# Patient Record
Sex: Female | Born: 1960 | Race: White | Hispanic: No | Marital: Married | Smoking: Former smoker
Health system: Southern US, Community
[De-identification: ages and names within clinical notes are randomized; demographics above are authoritative.]

## PROBLEM LIST (undated history)

## (undated) DIAGNOSIS — I10 Essential (primary) hypertension: Secondary | ICD-10-CM

## (undated) DIAGNOSIS — F32A Depression, unspecified: Secondary | ICD-10-CM

## (undated) DIAGNOSIS — M7989 Other specified soft tissue disorders: Secondary | ICD-10-CM

## (undated) DIAGNOSIS — K219 Gastro-esophageal reflux disease without esophagitis: Secondary | ICD-10-CM

## (undated) DIAGNOSIS — F329 Major depressive disorder, single episode, unspecified: Secondary | ICD-10-CM

## (undated) DIAGNOSIS — G43909 Migraine, unspecified, not intractable, without status migrainosus: Secondary | ICD-10-CM

## (undated) DIAGNOSIS — F988 Other specified behavioral and emotional disorders with onset usually occurring in childhood and adolescence: Secondary | ICD-10-CM

## (undated) DIAGNOSIS — G4730 Sleep apnea, unspecified: Secondary | ICD-10-CM

## (undated) DIAGNOSIS — E7800 Pure hypercholesterolemia, unspecified: Secondary | ICD-10-CM

## (undated) HISTORY — DX: Other specified soft tissue disorders: M79.89

## (undated) HISTORY — DX: Essential (primary) hypertension: I10

## (undated) HISTORY — DX: Other specified behavioral and emotional disorders with onset usually occurring in childhood and adolescence: F98.8

## (undated) HISTORY — DX: Major depressive disorder, single episode, unspecified: F32.9

## (undated) HISTORY — DX: Sleep apnea, unspecified: G47.30

## (undated) HISTORY — DX: Pure hypercholesterolemia, unspecified: E78.00

## (undated) HISTORY — DX: Depression, unspecified: F32.A

## (undated) HISTORY — DX: Migraine, unspecified, not intractable, without status migrainosus: G43.909

## (undated) HISTORY — DX: Gastro-esophageal reflux disease without esophagitis: K21.9

---
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
## 1979-06-30 HISTORY — PX: RHINOPLASTY: SUR1284

## 1998-05-08 ENCOUNTER — Ambulatory Visit (HOSPITAL_COMMUNITY): Admission: RE | Admit: 1998-05-08 | Discharge: 1998-05-08 | Payer: Self-pay | Admitting: Family Medicine

## 2000-01-23 ENCOUNTER — Encounter: Admission: RE | Admit: 2000-01-23 | Discharge: 2000-01-23 | Payer: Self-pay | Admitting: *Deleted

## 2000-01-23 ENCOUNTER — Encounter: Payer: Self-pay | Admitting: *Deleted

## 2000-02-05 ENCOUNTER — Other Ambulatory Visit: Admission: RE | Admit: 2000-02-05 | Discharge: 2000-02-05 | Payer: Self-pay | Admitting: Obstetrics and Gynecology

## 2001-12-08 ENCOUNTER — Encounter: Payer: Self-pay | Admitting: *Deleted

## 2001-12-08 ENCOUNTER — Encounter: Admission: RE | Admit: 2001-12-08 | Discharge: 2001-12-08 | Payer: Self-pay | Admitting: *Deleted

## 2003-06-26 ENCOUNTER — Encounter: Admission: RE | Admit: 2003-06-26 | Discharge: 2003-06-26 | Payer: Self-pay | Admitting: Surgery

## 2003-12-14 ENCOUNTER — Encounter: Admission: RE | Admit: 2003-12-14 | Discharge: 2003-12-14 | Payer: Self-pay | Admitting: Surgery

## 2003-12-21 ENCOUNTER — Encounter: Admission: RE | Admit: 2003-12-21 | Discharge: 2003-12-21 | Payer: Self-pay | Admitting: Family Medicine

## 2005-02-10 ENCOUNTER — Encounter: Admission: RE | Admit: 2005-02-10 | Discharge: 2005-02-10 | Payer: Self-pay | Admitting: Family Medicine

## 2006-02-05 ENCOUNTER — Other Ambulatory Visit: Admission: RE | Admit: 2006-02-05 | Discharge: 2006-02-05 | Payer: Self-pay | Admitting: Obstetrics and Gynecology

## 2006-02-11 ENCOUNTER — Encounter: Admission: RE | Admit: 2006-02-11 | Discharge: 2006-02-11 | Payer: Self-pay | Admitting: Family Medicine

## 2007-04-06 ENCOUNTER — Other Ambulatory Visit: Admission: RE | Admit: 2007-04-06 | Discharge: 2007-04-06 | Payer: Self-pay | Admitting: Obstetrics and Gynecology

## 2008-08-30 ENCOUNTER — Other Ambulatory Visit: Admission: RE | Admit: 2008-08-30 | Discharge: 2008-08-30 | Payer: Self-pay | Admitting: Obstetrics and Gynecology

## 2009-07-25 ENCOUNTER — Encounter: Admission: RE | Admit: 2009-07-25 | Discharge: 2009-07-25 | Payer: Self-pay | Admitting: Family Medicine

## 2009-09-24 ENCOUNTER — Other Ambulatory Visit: Admission: RE | Admit: 2009-09-24 | Discharge: 2009-09-24 | Payer: Self-pay | Admitting: Obstetrics and Gynecology

## 2011-03-31 ENCOUNTER — Other Ambulatory Visit: Payer: Self-pay | Admitting: Family Medicine

## 2011-03-31 DIAGNOSIS — Z1231 Encounter for screening mammogram for malignant neoplasm of breast: Secondary | ICD-10-CM

## 2011-04-13 ENCOUNTER — Ambulatory Visit
Admission: RE | Admit: 2011-04-13 | Discharge: 2011-04-13 | Disposition: A | Discharge status: Home / Self Care | Payer: BC Managed Care – PPO | Source: Ambulatory Visit | Attending: Family Medicine | Admitting: Family Medicine

## 2011-04-13 DIAGNOSIS — Z1231 Encounter for screening mammogram for malignant neoplasm of breast: Secondary | ICD-10-CM

## 2011-05-11 ENCOUNTER — Other Ambulatory Visit: Payer: Self-pay | Admitting: Family Medicine

## 2011-05-11 ENCOUNTER — Other Ambulatory Visit (HOSPITAL_COMMUNITY)
Admission: RE | Admit: 2011-05-11 | Discharge: 2011-05-11 | Disposition: A | Discharge status: Home / Self Care | Payer: BC Managed Care – PPO | Source: Ambulatory Visit | Attending: Family Medicine | Admitting: Family Medicine

## 2011-05-11 DIAGNOSIS — Z124 Encounter for screening for malignant neoplasm of cervix: Secondary | ICD-10-CM | POA: Insufficient documentation

## 2013-01-11 ENCOUNTER — Other Ambulatory Visit: Payer: Self-pay

## 2013-01-11 DIAGNOSIS — Z1231 Encounter for screening mammogram for malignant neoplasm of breast: Secondary | ICD-10-CM

## 2013-02-03 ENCOUNTER — Ambulatory Visit
Admission: RE | Admit: 2013-02-03 | Discharge: 2013-02-03 | Disposition: A | Discharge status: Home / Self Care | Payer: BC Managed Care – PPO | Source: Ambulatory Visit

## 2013-02-03 DIAGNOSIS — Z1231 Encounter for screening mammogram for malignant neoplasm of breast: Secondary | ICD-10-CM

## 2013-09-18 ENCOUNTER — Encounter: Payer: Self-pay | Admitting: Podiatry

## 2013-09-18 ENCOUNTER — Ambulatory Visit (INDEPENDENT_AMBULATORY_CARE_PROVIDER_SITE_OTHER): Payer: BC Managed Care – PPO | Admitting: Podiatry

## 2013-09-18 ENCOUNTER — Ambulatory Visit (INDEPENDENT_AMBULATORY_CARE_PROVIDER_SITE_OTHER): Payer: BC Managed Care – PPO

## 2013-09-18 VITALS — BP 134/81 | HR 100 | Resp 16 | Ht 65.0 in | Wt 260.0 lb

## 2013-09-18 DIAGNOSIS — M722 Plantar fascial fibromatosis: Secondary | ICD-10-CM

## 2013-09-18 MED ORDER — TRIAMCINOLONE ACETONIDE 10 MG/ML IJ SUSP
10.0000 mg | Freq: Once | INTRAMUSCULAR | Status: AC
  Administered 2013-09-18: 10 mg

## 2013-09-18 MED ORDER — DICLOFENAC SODIUM 75 MG PO TBEC: 75.0000 mg | DELAYED_RELEASE_TABLET | Freq: Two times a day (BID) | ORAL | Status: DC

## 2013-09-18 NOTE — Patient Instructions (Signed)

## 2013-09-18 NOTE — Progress Notes (Signed)
   Subjective:    Patient ID: Mary Larsen, female    DOB: Apr 17, 1961, 53 y.o.   MRN: [id number]  HPI Comments: "I have pain in the heel"  Patient c/o throbbing plantar heel right for 6 months. No AM pain. Swelling some by end of day. She is a Runner, broadcasting/film/videoteacher, so stands all day. Treated for fasciitis several years ago. Has tried inserts, gel cups, stretching-no relief.     Review of Systems  Constitutional: Positive for fatigue and unexpected weight change.  Cardiovascular: Positive for leg swelling.  Neurological: Positive for headaches.  Hematological: Bruises/bleeds easily.  Psychiatric/Behavioral: The patient is nervous/anxious.   All other systems reviewed and are negative.       Objective:   Physical Exam        Assessment & Plan:

## 2013-09-19 NOTE — Progress Notes (Signed)
Subjective:     Patient ID: Mary Larsen, female   DOB: 1961-04-03, 53 y.o.   MRN: [id number]  HPI patient presents with severe heel pain right of 6 months duration. States she's tried over-the-counter insoles stretching and gel cups without relief of symptoms and she cannot do any form of activity   Review of Systems  All other systems reviewed and are negative.       Objective:   Physical Exam  Nursing note and vitals reviewed. Constitutional: She is oriented to person, place, and time.  Cardiovascular: Intact distal pulses.   Musculoskeletal: Normal range of motion.  Neurological: She is oriented to person, place, and time.   neurovascular status intact with discomfort of a significant nature plantar right heel at the insertion of the tendon into the calcaneus     Assessment:     Severe plantar fasciitis right plantar heel insertional    Plan:     H&P and x-ray reviewed. At this time I injected the plantar fascia right 3 mg Kenalog 5 mg Xylocaine Marcaine mixture and applied fascially brace and placed on oral anti-inflammatory therapy. Patient will be seen back in 2 weeks and at this time I discussed orthotics for the long term to try to solve problem and I also gave her instructions on physical therapy

## 2013-10-02 ENCOUNTER — Ambulatory Visit (INDEPENDENT_AMBULATORY_CARE_PROVIDER_SITE_OTHER): Payer: BC Managed Care – PPO | Admitting: Podiatry

## 2013-10-02 ENCOUNTER — Encounter: Payer: Self-pay | Admitting: Podiatry

## 2013-10-02 VITALS — BP 146/86 | HR 111 | Resp 18 | Ht 65.0 in | Wt 260.0 lb

## 2013-10-02 DIAGNOSIS — M722 Plantar fascial fibromatosis: Secondary | ICD-10-CM

## 2013-10-02 NOTE — Progress Notes (Signed)
Subjective:     Patient ID: Mary Larsen, female   DOB: [birth year], 53 y.o.   MRN: [id number]  HPI patient states I'm doing much better with minimal discomfort when walking and the fascial brace really helping to elevate my arch   Review of Systems     Objective:   Physical Exam Neurovascular status intact with minimal discomfort plantar heel left at the insertion of the calcaneus    Assessment:     Improving plantar fasciitis left with inflammation    Plan:     Discussed how well the acute symptoms are doing and recommended orthotics to try to reduce the chronic plantar fasciitis that this patient has experienced. Patient is scanned for custom orthotics at the current time

## 2013-10-09 ENCOUNTER — Other Ambulatory Visit (HOSPITAL_COMMUNITY)
Admission: RE | Admit: 2013-10-09 | Discharge: 2013-10-09 | Disposition: A | Discharge status: Home / Self Care | Payer: BC Managed Care – PPO | Source: Ambulatory Visit | Attending: Family Medicine | Admitting: Family Medicine

## 2013-10-09 ENCOUNTER — Other Ambulatory Visit: Payer: Self-pay | Admitting: Family Medicine

## 2013-10-09 DIAGNOSIS — N760 Acute vaginitis: Secondary | ICD-10-CM | POA: Insufficient documentation

## 2013-10-09 DIAGNOSIS — Z01419 Encounter for gynecological examination (general) (routine) without abnormal findings: Secondary | ICD-10-CM | POA: Insufficient documentation

## 2013-10-09 DIAGNOSIS — Z1151 Encounter for screening for human papillomavirus (HPV): Secondary | ICD-10-CM | POA: Insufficient documentation

## 2013-10-10 ENCOUNTER — Other Ambulatory Visit: Payer: Self-pay | Admitting: Family Medicine

## 2013-10-10 DIAGNOSIS — R1030 Lower abdominal pain, unspecified: Secondary | ICD-10-CM

## 2013-10-20 ENCOUNTER — Ambulatory Visit: Payer: BC Managed Care – PPO | Admitting: *Deleted

## 2013-10-20 DIAGNOSIS — M722 Plantar fascial fibromatosis: Secondary | ICD-10-CM

## 2013-10-20 NOTE — Progress Notes (Signed)
   Subjective:    Patient ID: Mary Larsen, female    DOB: 09/25/1960, 53 y.o.   MRN: [id number]  HPI PICK UP ORTHOTICS AND GIVEN INSTRUCTION.   Review of Systems     Objective:   Physical Exam        Assessment & Plan:

## 2013-10-20 NOTE — Patient Instructions (Signed)

## 2013-10-23 ENCOUNTER — Encounter (INDEPENDENT_AMBULATORY_CARE_PROVIDER_SITE_OTHER): Payer: Self-pay

## 2013-10-23 ENCOUNTER — Ambulatory Visit
Admission: RE | Admit: 2013-10-23 | Discharge: 2013-10-23 | Disposition: A | Discharge status: Home / Self Care | Payer: BC Managed Care – PPO | Source: Ambulatory Visit | Attending: Family Medicine | Admitting: Family Medicine

## 2013-10-23 DIAGNOSIS — R1030 Lower abdominal pain, unspecified: Secondary | ICD-10-CM

## 2013-11-06 ENCOUNTER — Ambulatory Visit (INDEPENDENT_AMBULATORY_CARE_PROVIDER_SITE_OTHER): Payer: BC Managed Care – PPO | Admitting: Podiatry

## 2013-11-06 ENCOUNTER — Encounter: Payer: Self-pay | Admitting: Podiatry

## 2013-11-06 VITALS — BP 116/67 | HR 94 | Resp 16

## 2013-11-06 DIAGNOSIS — M722 Plantar fascial fibromatosis: Secondary | ICD-10-CM

## 2013-11-06 MED ORDER — TRIAMCINOLONE ACETONIDE 10 MG/ML IJ SUSP
10.0000 mg | Freq: Once | INTRAMUSCULAR | Status: AC
  Administered 2013-11-06: 10 mg

## 2013-11-07 NOTE — Progress Notes (Signed)
Subjective:     Patient ID: Mary Larsen, female   DOB: [birth year], 53 y.o.   MRN: [id number]  HPI patient states that she's noticed some discomfort now in the right heel with improvement over previous but still sore when pressed  Review of Systems     Objective:   Physical Exam Plantar fasciitis of the heel which is quite improved from previous with discomfort now in the right heel the left heel doing very well    Assessment:     Improvement of plantar fasciitis with a reoccurrence in the right heel   Plan:     Injected the right plantar fascia 3 mg Kenalog 5 mg Xylocaine Marcaine mixture and instructed on physical therapy continued orthotic usage continued anti-inflammatory and reappoint as needed

## 2014-04-20 ENCOUNTER — Other Ambulatory Visit: Payer: Self-pay | Admitting: Family Medicine

## 2014-04-20 ENCOUNTER — Other Ambulatory Visit: Payer: Self-pay

## 2014-04-20 DIAGNOSIS — Z1231 Encounter for screening mammogram for malignant neoplasm of breast: Secondary | ICD-10-CM

## 2014-04-20 DIAGNOSIS — E2839 Other primary ovarian failure: Secondary | ICD-10-CM

## 2014-05-16 ENCOUNTER — Ambulatory Visit: Payer: BC Managed Care – PPO

## 2014-06-13 ENCOUNTER — Ambulatory Visit: Payer: BC Managed Care – PPO

## 2014-06-13 ENCOUNTER — Other Ambulatory Visit: Payer: BC Managed Care – PPO

## 2014-07-09 ENCOUNTER — Ambulatory Visit
Admission: RE | Admit: 2014-07-09 | Discharge: 2014-07-09 | Disposition: A | Discharge status: Home / Self Care | Payer: BC Managed Care – PPO | Source: Ambulatory Visit

## 2014-07-09 ENCOUNTER — Ambulatory Visit
Admission: RE | Admit: 2014-07-09 | Discharge: 2014-07-09 | Disposition: A | Discharge status: Home / Self Care | Payer: BC Managed Care – PPO | Source: Ambulatory Visit | Attending: Family Medicine | Admitting: Family Medicine

## 2014-07-09 DIAGNOSIS — E2839 Other primary ovarian failure: Secondary | ICD-10-CM

## 2014-07-09 DIAGNOSIS — Z1231 Encounter for screening mammogram for malignant neoplasm of breast: Secondary | ICD-10-CM

## 2015-06-19 ENCOUNTER — Other Ambulatory Visit: Payer: Self-pay

## 2015-06-19 DIAGNOSIS — Z1231 Encounter for screening mammogram for malignant neoplasm of breast: Secondary | ICD-10-CM

## 2015-07-23 ENCOUNTER — Ambulatory Visit: Payer: BC Managed Care – PPO

## 2015-07-31 ENCOUNTER — Ambulatory Visit
Admission: RE | Admit: 2015-07-31 | Discharge: 2015-07-31 | Disposition: A | Discharge status: Home / Self Care | Payer: BC Managed Care – PPO | Source: Ambulatory Visit

## 2015-07-31 DIAGNOSIS — Z1231 Encounter for screening mammogram for malignant neoplasm of breast: Secondary | ICD-10-CM

## 2016-08-06 ENCOUNTER — Encounter: Payer: Self-pay | Admitting: Pulmonary Disease

## 2016-08-06 ENCOUNTER — Ambulatory Visit (INDEPENDENT_AMBULATORY_CARE_PROVIDER_SITE_OTHER): Payer: BC Managed Care – PPO | Admitting: Pulmonary Disease

## 2016-08-06 VITALS — BP 124/80 | HR 105 | Ht 65.0 in | Wt 268.0 lb

## 2016-08-06 DIAGNOSIS — G4733 Obstructive sleep apnea (adult) (pediatric): Secondary | ICD-10-CM

## 2016-08-06 NOTE — Assessment & Plan Note (Signed)
She was diagnosed with OSA 10-15 yrs ago.  Used cpap x 1 yr.  Had snoring, hypersomnia, gasping, choking. Very symptomatic.  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 She turned in.   Through the years, her symptoms have worsened. She  is very symptomatic. Has hypersomnia, snoring, tired, fatigue, unrefreshed sleep, frequent awakenings. ESS 7  Patient sleeptalks. No other abnormal behavior and sleep.  Plan : We discussed about the diagnosis of Obstructive Sleep Apnea (OSA) and implications of untreated OSA. We discussed about CPAP and BiPaP as possible treatment options.    We will schedule the patient for a sleep study. Plan for a HST. Anticipate moderate. May have issues with falling asleep on her stomach. Trial with CPAP. Did not telerate in the past.    Patient was instructed to call the office if he/she has not heard back from the office 1-2 weeks after the sleep study.   Patient was instructed to call the office if he/she is having issues with the PAP device.   We discussed good sleep hygiene.   Patient was advised not to engage in activities requiring concentration and/or vigilance if he/she is sleepy.  Patient was advised not to drive if he/she is sleepy.

## 2016-08-06 NOTE — Progress Notes (Signed)
Subjective:    Patient ID: Mary Larsen, female    DOB: [birth year], 56 y.o.   MRN: [id number]  HPI   This is the case of Mary Larsen, 56 y.o. Female, who was referred by Dr. Santiago GladMarshall Freeman in consultation regarding OSA   As you very well know, patient has a 12 PY smoking history, quit when she was 56 yrs old, has not been diagnosed with asthma or copd.   She was diagnosed with OSA 10-15 yrs ago.  Used cpap x 1 yr.  Had snoring, hypersomnia, gasping, choking. Very symptomatic.  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 She turned in.   Through the years, her symptoms have worsened. She  is very symptomatic. Has hypersomnia, snoring, tired, fatigue, unrefreshed sleep, frequent awakenings. ESS 7  Patient sleeptalks. No other abnormal behavior and sleep.   Review of Systems  Constitutional: Negative for fever and unexpected weight change.  HENT: Negative for congestion, dental problem, ear pain, nosebleeds, postnasal drip, rhinorrhea, sinus pressure, sneezing, sore throat and trouble swallowing.   Eyes: Negative for redness and itching.  Respiratory: Positive for shortness of breath. Negative for cough, chest tightness and wheezing.   Cardiovascular: Negative for palpitations and leg swelling.  Gastrointestinal: Negative for nausea and vomiting.  Genitourinary: Negative for dysuria.  Musculoskeletal: Positive for joint swelling.  Skin: Negative for rash.  Neurological: Positive for headaches.  Hematological: Does not bruise/bleed easily.  Psychiatric/Behavioral: Negative for dysphoric mood. The patient is nervous/anxious.    No past medical history on file.   (-) HTN (-) DM, CA, DVT. Depression.  (-) CAD.   No family history on file.  Father with HTN, afib Mother with CVA.   No past surgical history on file.  S/P tonsillectomy S/P nasal surgery for devaited septum  Social History   Social History  . Marital status: Married    Spouse name: N/A  .  Number of children: N/A  . Years of education: N/A   Occupational History  . Not on file.   Social History Main Topics  . Smoking status: Never Smoker  . Smokeless tobacco: Never Used  . Alcohol use Yes  . Drug use: Unknown  . Sexual activity: Not on file   Other Topics Concern  . Not on file   Social History Narrative  . No narrative on file   Worked as a Scientist, water qualityspecial needs teacher.   Allergies  Allergen Reactions  . Penicillins   . Sulfa Antibiotics      Outpatient Medications Prior to Visit  Medication Sig Dispense Refill  . UNABLE TO FIND Med Name: Vybrid-antidepressant    . amphetamine-dextroamphetamine (ADDERALL XR) 30 MG 24 hr capsule Take 30 mg by mouth daily.    . diclofenac (VOLTAREN) 75 MG EC tablet Take 1 tablet (75 mg total) by mouth 2 (two) times daily. 50 tablet 2  . zolpidem (AMBIEN CR) 12.5 MG CR tablet Take 12.5 mg by mouth at bedtime as needed for sleep.     No facility-administered medications prior to visit.    Meds ordered this encounter  Medications  . amphetamine-dextroamphetamine (ADDERALL XR) 20 MG 24 hr capsule    Sig: Take 20 mg by mouth daily.  . QUEtiapine Fumarate (SEROQUEL PO)    Sig: Take by mouth.        Objective:   Physical Exam Vitals:  Vitals:   08/06/16 1154  BP: 124/80  Pulse: (!) 105  SpO2: 98%  Weight: 268  lb (121.6 kg)  Height: 5\' 5"  (1.651 m)    Constitutional/General:  Pleasant, well-nourished, well-developed, not in any distress,  Comfortably seating.  Well kempt  Body mass index is 44.6 kg/m. Wt Readings from Last 3 Encounters:  08/06/16 268 lb (121.6 kg)  10/02/13 260 lb (117.9 kg)  09/18/13 260 lb (117.9 kg)     HEENT: Pupils equal and reactive to light and accommodation. Anicteric sclerae. Normal nasal mucosa.   No oral  lesions,  mouth clear,  oropharynx clear, no postnasal drip. (-) Oral thrush. No dental caries.  Airway - Mallampati class III  Neck: No masses. Midline trachea. No JVD, (-) LAD.  (-) bruits appreciated.  Respiratory/Chest: Grossly normal chest. (-) deformity. (-) Accessory muscle use.  Symmetric expansion. (-) Tenderness on palpation.  Resonant on percussion.  Diminished BS on both lower lung zones. (-) wheezing, crackles, rhonchi (-) egophony  Cardiovascular: Regular rate and  rhythm, heart sounds normal, no murmur or gallops, no peripheral edema  Gastrointestinal:  Normal bowel sounds. Soft, non-tender. No hepatosplenomegaly.  (-) masses.   Musculoskeletal:  Normal muscle tone. Normal gait.   Extremities: Grossly normal. (-) clubbing, cyanosis.  (-) edema  Skin: (-) rash,lesions seen.   Neurological/Psychiatric : alert, oriented to time, place, person. Normal mood and affect           Assessment & Plan:  OSA (obstructive sleep apnea) She was diagnosed with OSA 10-15 yrs ago.  Used cpap x 1 yr.  Had snoring, hypersomnia, gasping, choking. Very symptomatic.  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 She turned in.   Through the years, her symptoms have worsened. She  is very symptomatic. Has hypersomnia, snoring, tired, fatigue, unrefreshed sleep, frequent awakenings. ESS 7  Patient sleeptalks. No other abnormal behavior and sleep.  Plan : We discussed about the diagnosis of Obstructive Sleep Apnea (OSA) and implications of untreated OSA. We discussed about CPAP and BiPaP as possible treatment options.    We will schedule the patient for a sleep study. Plan for a HST. Anticipate moderate. May have issues with falling asleep on her stomach. Trial with CPAP. Did not telerate in the past.    Patient was instructed to call the office if he/she has not heard back from the office 1-2 weeks after the sleep study.   Patient was instructed to call the office if he/she is having issues with the PAP device.   We discussed good sleep hygiene.   Patient was advised not to engage in activities requiring concentration and/or vigilance if he/she  is sleepy.  Patient was advised not to drive if he/she is sleepy.        Thank you very much for letting me participate in this patient's care. Please do not hesitate to give me a call if you have any questions or concerns regarding the treatment plan.   Patient will follow up with me in 6-8 weeks    J. Alexis Frock, MD 08/06/2016   4:58 PM Pulmonary and Critical Care Medicine [facility] Pager: [phone] Office: [phone], Fax: [phone]

## 2016-08-06 NOTE — Patient Instructions (Signed)

## 2016-08-18 ENCOUNTER — Other Ambulatory Visit: Payer: Self-pay | Admitting: Family Medicine

## 2016-08-18 ENCOUNTER — Telehealth: Payer: Self-pay | Admitting: Pulmonary Disease

## 2016-08-18 DIAGNOSIS — Z1231 Encounter for screening mammogram for malignant neoplasm of breast: Secondary | ICD-10-CM

## 2016-08-18 NOTE — Telephone Encounter (Signed)
Spoke with pt, who states she has not heard anything about her HST that was ordered on 08-06-16.  [facility] Thanks.

## 2016-08-18 NOTE — Telephone Encounter (Signed)
Spoke to pt she is aware we have her order and someone should be calling her within the next few days to get her set up to pick up sleep machine Tobe SosSally E Ottinger

## 2016-08-25 DIAGNOSIS — G4733 Obstructive sleep apnea (adult) (pediatric): Secondary | ICD-10-CM

## 2016-08-26 ENCOUNTER — Telehealth: Payer: Self-pay | Admitting: Pulmonary Disease

## 2016-08-26 DIAGNOSIS — G4733 Obstructive sleep apnea (adult) (pediatric): Secondary | ICD-10-CM

## 2016-08-26 NOTE — Telephone Encounter (Signed)
  Please call the pt and tell the pt the HOME SLEEP STUDY  showed OSA. The study had good data (she was concerned).   Pt stops breathing 18   times an hour.   Home sleep study was done on : 08/25/16  Please order autoCPAP 5-15 cm H2O. Patient will need a mask fitting session. Patient will need a 1 month download.   Patient needs to be seen by me or any of the NPs/APPs  4-6 weeks after obtaining the cpap machine. Let me know if you receive this.   Thanks!   J. Alexis FrockAngelo A de Dios, MD 08/26/2016, 1:25 PM

## 2016-08-28 NOTE — Telephone Encounter (Signed)
Spoke with pt and made her aware of her results per AD.  Pt agreed to the order being placed. The order was placed. Nothing further is needed at this time. She wanted to wait till she receive her machine to schedule a follow up appointment

## 2016-08-31 DIAGNOSIS — G4733 Obstructive sleep apnea (adult) (pediatric): Secondary | ICD-10-CM | POA: Diagnosis not present

## 2016-09-01 ENCOUNTER — Other Ambulatory Visit: Payer: Self-pay | Admitting: *Deleted

## 2016-09-01 ENCOUNTER — Ambulatory Visit: Payer: BC Managed Care – PPO

## 2016-09-01 ENCOUNTER — Telehealth: Payer: Self-pay | Admitting: Pulmonary Disease

## 2016-09-01 DIAGNOSIS — G4733 Obstructive sleep apnea (adult) (pediatric): Secondary | ICD-10-CM

## 2016-09-01 NOTE — Telephone Encounter (Signed)
Spoke with Mary Larsen with Dr. Onnie Larsen's office, who request last ov notes to be faxed.  OV notes faxed to [phone].  Received confirmation. Nothing further needed.

## 2016-09-16 ENCOUNTER — Ambulatory Visit: Payer: BC Managed Care – PPO

## 2016-10-05 ENCOUNTER — Ambulatory Visit
Admission: RE | Admit: 2016-10-05 | Discharge: 2016-10-05 | Disposition: A | Discharge status: Home / Self Care | Payer: BC Managed Care – PPO | Source: Ambulatory Visit | Attending: Family Medicine | Admitting: Family Medicine

## 2016-10-05 DIAGNOSIS — Z1231 Encounter for screening mammogram for malignant neoplasm of breast: Secondary | ICD-10-CM

## 2016-10-15 ENCOUNTER — Encounter: Payer: Self-pay | Admitting: Adult Health

## 2016-10-15 ENCOUNTER — Ambulatory Visit (INDEPENDENT_AMBULATORY_CARE_PROVIDER_SITE_OTHER): Payer: BC Managed Care – PPO | Admitting: Adult Health

## 2016-10-15 DIAGNOSIS — G4733 Obstructive sleep apnea (adult) (pediatric): Secondary | ICD-10-CM | POA: Diagnosis not present

## 2016-10-15 NOTE — Patient Instructions (Signed)
Keep up the good work. Continue on C Pap at bedtime. Remain active and work on weight loss. Follow-up in 4-6 months and As needed

## 2016-10-15 NOTE — Assessment & Plan Note (Signed)
Moderate OSA with excellent control /compliance   Plan  Patient Instructions  Keep up the good work. Continue on C Pap at bedtime. Remain active and work on weight loss. Follow-up in 4-6 months and As needed

## 2016-10-15 NOTE — Assessment & Plan Note (Signed)
Wt loss encouraged  

## 2016-10-15 NOTE — Progress Notes (Signed)
  ID: Mary Larsen, female    DOB: [birth year], 56 y.o.   MRN: [id number]  Chief Complaint  Patient presents with  . Follow-up    OSA    Referring provider: Juluis Rainier, MD  HPI: 56 yo female followed for OSA   TEST  HST 07/2016 >18/hr   10/15/2016 Follow up : OSA  Pt returns for 2 month follow up. She was seen for sleep consult last ov for snoring and daytime sleepiness. She was set up for home sleep study found to have moderate OSA .  She was set up for CPAP At bedtime  . She says she is feeling so much . Feels rested w/ no sign daytime sleepiness.   Download shows excellent  Compliance with average usage at 6.5 hours. She is on AutoSet 5-15 cm of H2O. AHI 2.4. No leaks.     Allergies  Allergen Reactions  . Penicillins   . Sulfa Antibiotics      There is no immunization history on file for this patient.  History reviewed. No pertinent past medical history.  Tobacco History: History  Smoking Status  . Never Smoker  Smokeless Tobacco  . Never Used   Counseling given: Not Answered   Outpatient Encounter Prescriptions as of 10/15/2016  Medication Sig  . amphetamine-dextroamphetamine (ADDERALL XR) 20 MG 24 hr capsule Take 20 mg by mouth daily.  . QUEtiapine (SEROQUEL) 25 MG tablet Take 25 mg by mouth at bedtime.  . Vilazodone HCl (VIIBRYD) 40 MG TABS Take 40 mg by mouth daily.  . [DISCONTINUED] QUEtiapine Fumarate (SEROQUEL PO) Take by mouth.  . [DISCONTINUED] UNABLE TO FIND Med Name: Vybrid-antidepressant   No facility-administered encounter medications on file as of 10/15/2016.      Review of Systems  Constitutional:   No  weight loss, night sweats,  Fevers, chills, fatigue, or  lassitude.  HEENT:   No headaches,  Difficulty swallowing,  Tooth/dental problems, or  Sore throat,                No sneezing, itching, ear ache, nasal congestion, post nasal drip,   CV:  No chest pain,  Orthopnea, PND, swelling in lower extremities, anasarca,  dizziness, palpitations, syncope.   GI  No heartburn, indigestion, abdominal pain, nausea, vomiting, diarrhea, change in bowel habits, loss of appetite, bloody stools.   Resp: No shortness of breath with exertion or at rest.  No excess mucus, no productive cough,  No non-productive cough,  No coughing up of blood.  No change in color of mucus.  No wheezing.  No chest wall deformity  Skin: no rash or lesions.  GU: no dysuria, change in color of urine, no urgency or frequency.  No flank pain, no hematuria   MS:  No joint pain or swelling.  No decreased range of motion.  No back pain.    Physical Exam  BP 126/72 (BP Location: Left Arm, Cuff Size: Large)   Pulse 89   Ht  (1.651 m)   Wt 267 lb 9.6 oz (121.4 kg)   SpO2 97%   BMI 44.53 kg/m   GEN: A/Ox3; pleasant , NAD, obese    HEENT:  [place]/AT,  EACs-clear, TMs-wnl, NOSE-clear, THROAT-clear, no lesions, no postnasal drip or exudate noted. Class 2 MP airway   NECK:  Supple w/ fair ROM; no JVD; normal carotid impulses w/o bruits; no thyromegaly or nodules palpated; no lymphadenopathy.    RESP  Clear  P & A; w/o, wheezes/ rales/  or rhonchi. no accessory muscle use, no dullness to percussion  CARD:  RRR, no m/r/g, no peripheral edema, pulses intact, no cyanosis or clubbing.  GI:   Soft & nt; nml bowel sounds; no organomegaly or masses detected.   Musco: Warm bil, no deformities or joint swelling noted.   Neuro: alert, no focal deficits noted.    Skin: Warm, no lesions or rashes    Lab Results:  CBC No results found for: WBC, RBC, HGB, HCT, PLT, MCV, MCH, MCHC, RDW, LYMPHSABS, MONOABS, EOSABS, BASOSABS  BMET No results found for: NA, K, CL, CO2, GLUCOSE, BUN, CREATININE, CALCIUM, GFRNONAA, GFRAA  BNP No results found for: BNP  ProBNP No results found for: PROBNP  Imaging: Mm Screening Breast Tomo Bilateral  Result Date: 10/06/2016 CLINICAL DATA:  Screening. EXAM: 2D DIGITAL SCREENING BILATERAL MAMMOGRAM WITH CAD  AND ADJUNCT TOMO COMPARISON:  Previous exam(s). ACR Breast Density Category a: The breast tissue is almost entirely fatty. FINDINGS: There are no findings suspicious for malignancy. Images were processed with CAD. IMPRESSION: No mammographic evidence of malignancy. A result letter of this screening mammogram will be mailed directly to the patient. RECOMMENDATION: Screening mammogram in one year. (Code:SM-B-01Y) BI-RADS CATEGORY  1: Negative. Electronically Signed   By: Frederico Hamman M.D.   On: 10/06/2016 07:50     Assessment & Plan:   OSA (obstructive sleep apnea) Moderate OSA with excellent control /compliance   Plan  Patient Instructions  Keep up the good work. Continue on C Pap at bedtime. Remain active and work on weight loss. Follow-up in 4-6 months and As needed      Morbid obesity (HCC) Wt loss encouraged      Rubye Oaks, NP 10/15/2016

## 2017-03-02 ENCOUNTER — Ambulatory Visit (INDEPENDENT_AMBULATORY_CARE_PROVIDER_SITE_OTHER): Payer: BC Managed Care – PPO | Admitting: Adult Health

## 2017-03-02 ENCOUNTER — Encounter: Payer: Self-pay | Admitting: Adult Health

## 2017-03-02 DIAGNOSIS — G4733 Obstructive sleep apnea (adult) (pediatric): Secondary | ICD-10-CM

## 2017-03-02 NOTE — Patient Instructions (Signed)
Keep up the good work. Continue on C Pap at bedtime. Remain active and work on weight loss. Follow-up in 1 year with Dr. Craige CottaSood or Lillyanna Glandon NP  And As needed

## 2017-03-02 NOTE — Assessment & Plan Note (Signed)
Well controlled   Plan  Patient Instructions  Keep up the good work. Continue on C Pap at bedtime. Remain active and work on weight loss. Follow-up in 1 year with Dr. Craige CottaSood or Parrett NP  And As needed

## 2017-03-02 NOTE — Assessment & Plan Note (Signed)
Wt loss  

## 2017-03-02 NOTE — Progress Notes (Signed)
@Patient  ID: Mary Larsen, female    DOB: 01/21/1961, 56 y.o.   MRN: [id number]  Chief Complaint  Patient presents with  . Follow-up    OSA     Referring provider: Juluis Rainier, MD    HPI: 56 yo female followed for moderate OSA   TEST  HST 07/2016 >18/hr   03/02/2017 Follow up: OSA  Pt returns for 4 month follow up for sleep apnea. She has started on CPAP earlier this year for sleep apnea. She feels it helps her w Charlyne Petrin daytime sleepiness.  Download shows 63% usage w/ 7hr daily usage. AHI 1.7. Min leaks. She remains on Auto set 5-15cmH20. Says she had to help with father who is terminally ill . She has been traveling a lot lately . We dicussed compliance and usage.     Allergies  Allergen Reactions  . Penicillins   . Sulfa Antibiotics     Immunization History  Administered Date(s) Administered  . Influenza [place] 01/27/2017    No past medical history on file.  Tobacco History: History  Smoking Status  . Never Smoker  Smokeless Tobacco  . Never Used   Counseling given: Not Answered   Outpatient Encounter Prescriptions as of 03/02/2017  Medication Sig  . amphetamine-dextroamphetamine (ADDERALL XR) 20 MG 24 hr capsule Take 20 mg by mouth daily.  . QUEtiapine (SEROQUEL) 25 MG tablet Take 25 mg by mouth at bedtime.  . SUMAtriptan (IMITREX) 100 MG tablet Take 100 mg by mouth every 2 (two) hours as needed for migraine. May repeat in 2 hours if headache persists or recurs.  . Vilazodone HCl (VIIBRYD) 40 MG TABS Take 40 mg by mouth daily.   No facility-administered encounter medications on file as of 03/02/2017.      Review of Systems  Constitutional:   No  weight loss, night sweats,  Fevers, chills, fatigue, or  lassitude.  HEENT:   No headaches,  Difficulty swallowing,  Tooth/dental problems, or  Sore throat,                No sneezing, itching, ear ache, nasal congestion, post nasal drip,   CV:  No chest pain,  Orthopnea, PND, swelling in lower extremities,  anasarca, dizziness, palpitations, syncope.   GI  No heartburn, indigestion, abdominal pain, nausea, vomiting, diarrhea, change in bowel habits, loss of appetite, bloody stools.   Resp: No shortness of breath with exertion or at rest.  No excess mucus, no productive cough,  No non-productive cough,  No coughing up of blood.  No change in color of mucus.  No wheezing.  No chest wall deformity  Skin: no rash or lesions.  GU: no dysuria, change in color of urine, no urgency or frequency.  No flank pain, no hematuria   MS:  No joint pain or swelling.  No decreased range of motion.  No back pain.    Physical Exam  BP (!) 144/82 (BP Location: Left Arm, Cuff Size: Large)   Pulse 100   Ht 5\' 5"  (1.651 m)   Wt 267 lb 3.2 oz (121.2 kg)   SpO2 98%   BMI 44.46 kg/m   GEN: A/Ox3; pleasant , NAD, obese    HEENT:  [place]/AT,  EACs-clear, TMs-wnl, NOSE-clear, THROAT-clear, no lesions, no postnasal drip or exudate noted. Class 2-3 MP airway   NECK:  Supple w/ fair ROM; no JVD; normal carotid impulses w/o bruits; no thyromegaly or nodules palpated; no lymphadenopathy.    RESP  Clear  P &  A; w/o, wheezes/ rales/ or rhonchi. no accessory muscle use, no dullness to percussion  CARD:  RRR, no m/r/g, no peripheral edema, pulses intact, no cyanosis or clubbing.  GI:   Soft & nt; nml bowel sounds; no organomegaly or masses detected.   Musco: Warm bil, no deformities or joint swelling noted.   Neuro: alert, no focal deficits noted.    Skin: Warm, no lesions or rashes    Lab Results:  CBC No results found for: WBC, RBC, HGB, HCT, PLT, MCV, MCH, MCHC, RDW, LYMPHSABS, MONOABS, EOSABS, BASOSABS  BMET No results found for: NA, K, CL, CO2, GLUCOSE, BUN, CREATININE, CALCIUM, GFRNONAA, GFRAA  BNP No results found for: BNP  ProBNP No results found for: PROBNP  Imaging: No results found.   Assessment & Plan:   OSA (obstructive sleep apnea) Well controlled   Plan  Patient Instructions    Keep up the good work. Continue on C Pap at bedtime. Remain active and work on weight loss. Follow-up in 1 year with Dr. Craige CottaSood or Parrett NP  And As needed        Morbid obesity [facility](HCC) Wt loss      Rubye Oaksammy Parrett, NP 03/02/2017

## 2017-03-02 NOTE — Progress Notes (Signed)
I have reviewed and agree with assessment/plan.  Coralyn HellingVineet Parth Mccormac, MD [facility] 03/02/2017, 2:45 PM Pager:  [phone]

## 2017-04-12 ENCOUNTER — Other Ambulatory Visit: Payer: Self-pay | Admitting: Family Medicine

## 2017-04-12 ENCOUNTER — Other Ambulatory Visit (HOSPITAL_COMMUNITY)
Admission: RE | Admit: 2017-04-12 | Discharge: 2017-04-12 | Disposition: A | Discharge status: Home / Self Care | Payer: BC Managed Care – PPO | Source: Ambulatory Visit | Attending: Family Medicine | Admitting: Family Medicine

## 2017-04-12 DIAGNOSIS — Z124 Encounter for screening for malignant neoplasm of cervix: Secondary | ICD-10-CM | POA: Diagnosis present

## 2017-04-13 LAB — CYTOLOGY - PAP: Diagnosis: NEGATIVE

## 2017-04-14 ENCOUNTER — Other Ambulatory Visit: Payer: Self-pay | Admitting: Family Medicine

## 2017-04-14 DIAGNOSIS — M8580 Other specified disorders of bone density and structure, unspecified site: Secondary | ICD-10-CM

## 2017-05-03 ENCOUNTER — Other Ambulatory Visit: Payer: BC Managed Care – PPO

## 2017-05-26 ENCOUNTER — Other Ambulatory Visit: Payer: BC Managed Care – PPO

## 2017-09-09 ENCOUNTER — Encounter (INDEPENDENT_AMBULATORY_CARE_PROVIDER_SITE_OTHER): Payer: BC Managed Care – PPO

## 2017-09-16 ENCOUNTER — Encounter (INDEPENDENT_AMBULATORY_CARE_PROVIDER_SITE_OTHER): Payer: Self-pay | Admitting: Family Medicine

## 2017-09-16 ENCOUNTER — Ambulatory Visit (INDEPENDENT_AMBULATORY_CARE_PROVIDER_SITE_OTHER): Payer: BC Managed Care – PPO | Admitting: Family Medicine

## 2017-09-16 VITALS — BP 108/75 | HR 88 | Temp 98.5°F | Ht 64.0 in | Wt 262.0 lb

## 2017-09-16 DIAGNOSIS — Z1331 Encounter for screening for depression: Secondary | ICD-10-CM | POA: Diagnosis not present

## 2017-09-16 DIAGNOSIS — R5383 Other fatigue: Secondary | ICD-10-CM | POA: Diagnosis not present

## 2017-09-16 DIAGNOSIS — Z9189 Other specified personal risk factors, not elsewhere classified: Secondary | ICD-10-CM

## 2017-09-16 DIAGNOSIS — Z6841 Body Mass Index (BMI) 40.0 and over, adult: Secondary | ICD-10-CM | POA: Diagnosis not present

## 2017-09-16 DIAGNOSIS — G4733 Obstructive sleep apnea (adult) (pediatric): Secondary | ICD-10-CM | POA: Insufficient documentation

## 2017-09-16 DIAGNOSIS — R0602 Shortness of breath: Secondary | ICD-10-CM | POA: Insufficient documentation

## 2017-09-16 DIAGNOSIS — Z0289 Encounter for other administrative examinations: Secondary | ICD-10-CM

## 2017-09-16 DIAGNOSIS — F418 Other specified anxiety disorders: Secondary | ICD-10-CM | POA: Insufficient documentation

## 2017-09-16 NOTE — Progress Notes (Signed)
.  Office: [phone]  /  Fax: [phone]   HPI:   Chief Complaint: OBESITY  Mary Larsen (MR# [id number]) is a 57 y.o. female who presents on 09/16/2017 for obesity evaluation and treatment. Current BMI is Body mass index is 44.97 kg/m.Mary Larsen has struggled with obesity for years and has been unsuccessful in either losing weight or maintaining long term weight loss. Mary Larsen attended our information session and states she is currently in the action stage of change and ready to dedicate time achieving and maintaining a healthier weight.   Mary Larsen states her family eats meals together she thinks her family will eat healthier with  her she struggles with family and or coworkers weight loss sabotage her desired weight loss is 102 lbs she has been heavy most of  her life she started gaining weight in 4th grade her heaviest weight ever was 275 lbs she is a picky eater and doesn't like to eat healthier foods  she has significant food cravings issues  she snacks frequently in the evenings she skips meals frequently she is frequently drinking liquids with calories she frequently makes poor food choices she struggles with emotional eating    Fatigue Mary Larsen feels her energy is lower than it should be. This has worsened with weight gain and has not worsened recently. Mary Larsen admits to daytime somnolence and  admits to waking up still tired. Patient has a history of obstructive sleep apnea, not using CPAP. Patent has a history of symptoms of daytime fatigue. Patient generally gets 6 hours of sleep per night, and states they generally have nightime awakenings. Snoring is present. Apneic episodes are present. Epworth Sleepiness Score is 6.  Dyspnea on exertion Zamorah notes increasing shortness of breath with exercising and seems to be worsening over time with weight gain. She notes getting out of breath sooner with activity than she used to. This has not gotten worse recently. Mary Larsen denies  orthopnea.  Depression with Anxiety Mary Larsen is on multiple medications and she is followed by Psychology Nurse Practitioner. Mary Larsen struggles with emotional eating and using food for comfort to the extent that it is negatively impacting her health. She often snacks when she is not hungry. Mary Larsen sometimes feels she is out of control and then feels guilty that she made poor food choices. She has been working on behavior modification techniques to help reduce her emotional eating and has been somewhat successful. She shows no sign of suicidal or homicidal ideations.  Depression screen PHQ 2/9 09/16/2017  Decreased Interest 3  Down, Depressed, Hopeless 3  PHQ - 2 Score 6  Altered sleeping 3  Tired, decreased energy 3  Change in appetite 2  Feeling bad or failure about yourself  3  Trouble concentrating 3  Moving slowly or fidgety/restless 3  Suicidal thoughts 0  PHQ-9 Score 23  Difficult doing work/chores Extremely dIfficult   Obstructive Sleep Apnea Mary Larsen has a diagnosis of obstructive sleep apnea. Uncontrolled, she is not using CPAP. She notes fatigue and is not sleeping well.  At risk for cardiovascular disease Mary Larsen is at a higher than average risk for cardiovascular disease due to obesity. She currently denies any chest pain.  Depression Screen Mary Larsen's Food and Mood (modified PHQ-9) score was  Depression screen PHQ 2/9 09/16/2017  Decreased Interest 3  Down, Depressed, Hopeless 3  PHQ - 2 Score 6  Altered sleeping 3  Tired, decreased energy 3  Change in appetite 2  Feeling bad or failure about yourself  3  Trouble concentrating 3  Moving slowly or fidgety/restless 3  Suicidal thoughts 0  PHQ-9 Score 23  Difficult doing work/chores Extremely dIfficult    ALLERGIES: Allergies  Allergen Reactions  . Penicillins   . Sulfa Antibiotics     MEDICATIONS: Current Outpatient Medications on File Prior to Visit  Medication Sig Dispense Refill  . amphetamine-dextroamphetamine  (ADDERALL XR) 20 MG 24 hr capsule Take 20 mg by mouth daily.    . bifidobacterium infantis (ALIGN) capsule Take 1 capsule by mouth daily.    Marland Kitchen buPROPion (WELLBUTRIN XL) 300 MG 24 hr tablet Take 300 mg by mouth daily.    . cloNIDine (CATAPRES) 0.1 MG tablet Take 0.1 mg by mouth daily.    . Melatonin 1 MG TABS Take 3 mg by mouth at bedtime as needed.    Marland Kitchen QUEtiapine (SEROQUEL) 25 MG tablet Take 25 mg by mouth at bedtime.    . SUMAtriptan (IMITREX) 100 MG tablet Take 100 mg by mouth every 2 (two) hours as needed for migraine. May repeat in 2 hours if headache persists or recurs.    . Vilazodone HCl (VIIBRYD) 40 MG TABS Take 40 mg by mouth daily.     No current facility-administered medications on file prior to visit.     PAST MEDICAL HISTORY: Past Medical History:  Diagnosis Date  . Depression   . High blood pressure   . Migraines   . Sleep apnea     PAST SURGICAL HISTORY: Past Surgical History:  Procedure Laterality Date  . RHINOPLASTY  1981   Deviated Septum  . TONSILLECTOMY  1973    SOCIAL HISTORY: Social History   Tobacco Use  . Smoking status: Former Smoker    Packs/day: 1.00    Years: 10.00    Pack years: 10.00    Types: Cigarettes    Last attempt to quit: 1989    Years since quitting: 30.2  . Smokeless tobacco: Never Used  Substance Use Topics  . Alcohol use: Yes  . Drug use: Not on file    FAMILY HISTORY: Family History  Problem Relation Age of Onset  . Stroke Mother   . Cancer Mother   . Obesity Mother   . High blood pressure Father   . High Cholesterol Father   . Heart disease Father   . Kidney disease Father   . Cancer Father   . Sleep apnea Father   . Liver disease Father   . Obesity Father     ROS: Review of Systems  Constitutional: Positive for malaise/fatigue. Negative for weight loss.       + Trouble sleeping  HENT:       + Hay fever + Dry mouth  Eyes:       + Wear glasses or contacts + Floaters  Respiratory: Positive for shortness  of breath (with exertion).   Cardiovascular: Negative for orthopnea.  Genitourinary: Positive for frequency.  Neurological: Positive for headaches (migraines).  Psychiatric/Behavioral: Positive for depression. Negative for suicidal ideas.       + Stress    PHYSICAL EXAM: Blood pressure 108/75, pulse 88, temperature 98.5 F (36.9 C), temperature source Oral, height 5\' 4"  (1.626 m), weight 262 lb (118.8 kg), SpO2 98 %. Body mass index is 44.97 kg/m. Physical Exam  Constitutional: She is oriented to person, place, and time. She appears well-developed and well-nourished.  HENT:  Head: Normocephalic and atraumatic.  Nose: Nose normal.  Eyes: EOM are normal. No scleral icterus.  Neck: Normal range of  motion. Neck supple. No thyromegaly present.  Cardiovascular: Normal rate and regular rhythm.  Pulmonary/Chest: Effort normal. No respiratory distress.  Abdominal: Soft. There is no tenderness.  + Obesity  Musculoskeletal:  Range of Motion normal in all 4 extremities Trace edema noted in bilateral lower extremities  Neurological: She is alert and oriented to person, place, and time. Coordination normal.  Skin: Skin is warm and dry.  Psychiatric: She has a normal mood and affect. Her behavior is normal.  Vitals reviewed.   RECENT LABS AND TESTS: BMET No results found for: NA, K, CL, CO2, GLUCOSE, BUN, CREATININE, CALCIUM, GFRNONAA, GFRAA No results found for: HGBA1C No results found for: INSULIN CBC No results found for: WBC, RBC, HGB, HCT, PLT, MCV, MCH, MCHC, RDW, LYMPHSABS, MONOABS, EOSABS, BASOSABS Iron/TIBC/Ferritin/ %Sat No results found for: IRON, TIBC, FERRITIN, IRONPCTSAT Lipid Panel  No results found for: CHOL, TRIG, HDL, CHOLHDL, VLDL, LDLCALC, LDLDIRECT Hepatic Function Panel  No results found for: PROT, ALBUMIN, AST, ALT, ALKPHOS, BILITOT, BILIDIR, IBILI No results found for: TSH Vitamin D No recent labs  ECG  shows NSR with a rate of 87 BPM INDIRECT CALORIMETER  done today shows a VO2 of 291 and a REE of 2023. Her calculated basal metabolic rate is [id number] thus her basal metabolic rate is better than expected.    ASSESSMENT AND PLAN: Other fatigue - Plan: EKG 12-Lead, CBC With Differential, Comprehensive metabolic panel, Folate, Hemoglobin A1c, Insulin, random, Lipid Panel With LDL/HDL Ratio, T3, T4, free, TSH, VITAMIN D 25 Hydroxy (Vit-D Deficiency, Fractures), Vitamin B12  Shortness of breath on exertion - Plan: CBC With Differential  Depression with anxiety  Obstructive sleep apnea syndrome  Depression screening  At risk for heart disease  Class 3 severe obesity with serious comorbidity and body mass index (BMI) of 45.0 to 49.9 in adult, unspecified obesity type (HCC)  PLAN:  Fatigue Mary Larsen was informed that her fatigue may be related to obesity, depression or many other causes. Labs will be ordered, and in the meanwhile Mary Larsen has agreed to work on diet, exercise and weight loss to help with fatigue. Proper sleep hygiene was discussed including the need for 7-8 hours of quality sleep each night. A sleep study was not ordered based on symptoms and Epworth score.  Dyspnea on exertion Mary Larsen's shortness of breath appears to be obesity related and exercise induced. She has agreed to work on weight loss and gradually increase exercise to treat her exercise induced shortness of breath. If Weronika follows our instructions and loses weight without improvement of her shortness of breath, we will plan to refer to pulmonology. We will monitor this condition regularly. Mary Larsen agrees to this plan.  Depression with Anxiety We discussed behavior modification techniques today to help Mary Larsen deal with her emotional eating and depression. We discussed cognitive behavioral therapy to help decrease emotional eating. Mary Larsen agrees to continue her medications as is for now and she agrees to follow up with our clinic in 2 weeks.  Obstructive Sleep Apnea Mary Larsen was  strongly encouraged to use CPAP to help keep RMR from decreasing and decrease risk of heart diease. Mary Larsen agrees to follow up with our clinic in 2 weeks.   Cardiovascular risk counselling Mary Larsen was given extended (15 minutes) coronary artery disease prevention counseling today. She is 57 y.o. female and has risk factors for heart disease including obesity and obstructive sleep apnea. We discussed intensive lifestyle modifications today with an emphasis on specific weight loss instructions and strategies. Pt was also  informed of the importance of increasing exercise and decreasing saturated fats to help prevent heart disease.  Depression Screen Mary Larsen had a strongly positive depression screening. Depression is commonly associated with obesity and often results in emotional eating behaviors. We will monitor this closely and work on CBT to help improve the non-hunger eating patterns. Referral to Psychology may be required if no improvement is seen as she continues in our clinic.  Obesity Alise is currently in the action stage of change and her goal is to continue with weight loss efforts She has agreed to follow the Category 3 plan Mary Larsen has been instructed to work up to a goal of 150 minutes of combined cardio and strengthening exercise per week for weight loss and overall health benefits. We discussed the following Behavioral Modification Strategies today: increasing lean protein intake, decreasing simple carbohydrates, work on meal planning and easy cooking plans and emotional eating strategies  Mary Larsen has agreed to follow up with our clinic in 2 weeks. She was informed of the importance of frequent follow up visits to maximize her success with intensive lifestyle modifications for her multiple health conditions. She was informed we would discuss her lab results at her next visit unless there is a critical issue that needs to be addressed sooner. Ania agreed to keep her next visit at the agreed  upon time to discuss these results.    OBESITY BEHAVIORAL INTERVENTION VISIT  Today's visit was # 1 out of 22.  Starting weight: 262 Starting date: 09/16/17 Today's weight : 262 lbs Today's date: 09/16/2017 Total lbs lost to date: 0 (Patients must lose 7 lbs in the first 6 months to continue with counseling)   ASK: We discussed the diagnosis of obesity with Mary Larsen today and Breslin agreed to give [place] permission to discuss obesity behavioral modification therapy today.  ASSESS: Shania has the diagnosis of obesity and her BMI today is 44.95 Nohea is in the action stage of change   ADVISE: Sholanda was educated on the multiple health risks of obesity as well as the benefit of weight loss to improve her health. She was advised of the need for long term treatment and the importance of lifestyle modifications.  AGREE: Multiple dietary modification options and treatment options were discussed and  Chrissie agreed to the above obesity treatment plan.   I, Burt Knack, am acting as transcriptionist for Quillian Quince, MD   I have reviewed the above documentation for accuracy and completeness, and I agree with the above. -Quillian Quince, MD

## 2017-09-17 LAB — VITAMIN D 25 HYDROXY (VIT D DEFICIENCY, FRACTURES): Vit D, 25-Hydroxy: 26 ng/mL — ABNORMAL LOW (ref 30.0–100.0)

## 2017-09-17 LAB — LIPID PANEL WITH LDL/HDL RATIO
Cholesterol, Total: 204 mg/dL — ABNORMAL HIGH (ref 100–199)
HDL: 58 mg/dL (ref >39)
LDL Calculated: 127 mg/dL — ABNORMAL HIGH (ref 0–99)
LDl/HDL Ratio: 2.2 ratio (ref 0.0–3.2)
Triglycerides: 94 mg/dL (ref 0–149)
VLDL Cholesterol Cal: 19 mg/dL (ref 5–40)

## 2017-09-17 LAB — COMPREHENSIVE METABOLIC PANEL
ALT: 11 IU/L (ref 0–32)
AST: 11 IU/L (ref 0–40)
Albumin/Globulin Ratio: 2.6 — ABNORMAL HIGH (ref 1.2–2.2)
Albumin: 4.6 g/dL (ref 3.5–5.5)
Alkaline Phosphatase: 76 IU/L (ref 39–117)
BUN/Creatinine Ratio: 15 (ref 9–23)
BUN: 12 mg/dL (ref 6–24)
Bilirubin Total: 0.5 mg/dL (ref 0.0–1.2)
CO2: 24 mmol/L (ref 20–29)
Calcium: 9.2 mg/dL (ref 8.7–10.2)
Chloride: 104 mmol/L (ref 96–106)
Creatinine, Ser: 0.81 mg/dL (ref 0.57–1.00)
GFR calc Af Amer: 93 mL/min/{1.73_m2} (ref >59)
GFR calc non Af Amer: 81 mL/min/{1.73_m2} (ref >59)
Globulin, Total: 1.8 g/dL (ref 1.5–4.5)
Glucose: 102 mg/dL — ABNORMAL HIGH (ref 65–99)
Potassium: 5 mmol/L (ref 3.5–5.2)
Sodium: 142 mmol/L (ref 134–144)
Total Protein: 6.4 g/dL (ref 6.0–8.5)

## 2017-09-17 LAB — CBC WITH DIFFERENTIAL
Basophils Absolute: 0 10*3/uL (ref 0.0–0.2)
Basos: 0 %
EOS (ABSOLUTE): 0.2 10*3/uL (ref 0.0–0.4)
Eos: 3 %
Hematocrit: 39.8 % (ref 34.0–46.6)
Hemoglobin: 13.3 g/dL (ref 11.1–15.9)
Immature Grans (Abs): 0 10*3/uL (ref 0.0–0.1)
Immature Granulocytes: 1 %
Lymphocytes Absolute: 1.8 10*3/uL (ref 0.7–3.1)
Lymphs: 30 %
MCH: 29.3 pg (ref 26.6–33.0)
MCHC: 33.4 g/dL (ref 31.5–35.7)
MCV: 88 fL (ref 79–97)
Monocytes Absolute: 0.3 10*3/uL (ref 0.1–0.9)
Monocytes: 5 %
Neutrophils Absolute: 3.7 10*3/uL (ref 1.4–7.0)
Neutrophils: 61 %
RBC: 4.54 x10E6/uL (ref 3.77–5.28)
RDW: 13.6 % (ref 12.3–15.4)
WBC: 6 10*3/uL (ref 3.4–10.8)

## 2017-09-17 LAB — TSH: TSH: 3.45 u[IU]/mL (ref 0.450–4.500)

## 2017-09-17 LAB — T3: T3, Total: 131 ng/dL (ref 71–180)

## 2017-09-17 LAB — FOLATE: Folate: 18.1 ng/mL (ref >3.0)

## 2017-09-17 LAB — INSULIN, RANDOM: INSULIN: 16.2 u[IU]/mL (ref 2.6–24.9)

## 2017-09-17 LAB — HEMOGLOBIN A1C
Est. average glucose Bld gHb Est-mCnc: 100 mg/dL
Hgb A1c MFr Bld: 5.1 % (ref 4.8–5.6)

## 2017-09-17 LAB — VITAMIN B12: Vitamin B-12: 285 pg/mL (ref 232–1245)

## 2017-09-17 LAB — T4, FREE: Free T4: 1.09 ng/dL (ref 0.82–1.77)

## 2017-09-30 ENCOUNTER — Ambulatory Visit (INDEPENDENT_AMBULATORY_CARE_PROVIDER_SITE_OTHER): Payer: BC Managed Care – PPO | Admitting: Family Medicine

## 2017-09-30 VITALS — BP 135/88 | HR 117 | Temp 98.6°F | Ht 64.0 in | Wt 253.0 lb

## 2017-09-30 DIAGNOSIS — E559 Vitamin D deficiency, unspecified: Secondary | ICD-10-CM | POA: Diagnosis not present

## 2017-09-30 DIAGNOSIS — R7303 Prediabetes: Secondary | ICD-10-CM | POA: Diagnosis not present

## 2017-09-30 DIAGNOSIS — Z6841 Body Mass Index (BMI) 40.0 and over, adult: Secondary | ICD-10-CM

## 2017-09-30 DIAGNOSIS — E66813 Obesity, class 3: Secondary | ICD-10-CM

## 2017-09-30 DIAGNOSIS — Z9189 Other specified personal risk factors, not elsewhere classified: Secondary | ICD-10-CM | POA: Diagnosis not present

## 2017-09-30 MED ORDER — VITAMIN D (ERGOCALCIFEROL) 1.25 MG (50000 UNIT) PO CAPS: 50000.0000 [IU] | ORAL_CAPSULE | ORAL | 0 refills | Status: DC

## 2017-10-04 ENCOUNTER — Ambulatory Visit (INDEPENDENT_AMBULATORY_CARE_PROVIDER_SITE_OTHER): Payer: BC Managed Care – PPO | Admitting: Family Medicine

## 2017-10-04 NOTE — Progress Notes (Signed)
Office: [phone]  /  Fax: [phone]   HPI:   Chief Complaint: OBESITY Mary Larsen is here to discuss her progress with her obesity treatment plan. She is on the Category 3 plan and is following her eating plan approximately 95 % of the time. She states she is walking 3,500-5,000 steps per day. Mary Larsen did very well with weight loss and was satisfied with Category 3 plan. Hunger was controlled but she struggled to eat all her protein especially in the evening. She would like to start water aerobics soon.  Her weight is 253 lb (114.8 kg) today and has had a weight loss of 9 pounds over a period of 2 weeks since her last visit. She has lost 9 lbs since starting treatment with us.  Vitamin D Deficiency Mary Larsen has a diagnosis of vitamin D deficiency. She is not on Vit D, she notes fatigue and denies nausea, vomiting or muscle weakness.  Pre-Diabetes Mary Larsen has a new diagnosis of pre-diabetes. A1c is normal but fasting glucose and fasting insulin is elevated and was informed this puts her at greater risk of developing diabetes. She denies polyphagia on diet prescription. She is not taking metformin currently and continues to work on diet and exercise to decrease risk of diabetes. She denies nausea or hypoglycemia.  At risk for diabetes Mary Larsen is at higher than average risk for developing diabetes due to her obesity and pre-diabetes. She currently denies polyuria or polydipsia.  ALLERGIES: Allergies  Allergen Reactions  . Penicillins   . Sulfa Antibiotics     MEDICATIONS: Current Outpatient Medications on File Prior to Visit  Medication Sig Dispense Refill  . amphetamine-dextroamphetamine (ADDERALL XR) 20 MG 24 hr capsule Take 20 mg by mouth daily.    . bifidobacterium infantis (ALIGN) capsule Take 1 capsule by mouth daily.    Marland Kitchen. buPROPion (WELLBUTRIN XL) 300 MG 24 hr tablet Take 300 mg by mouth daily.    . cloNIDine (CATAPRES) 0.1 MG tablet Take 0.1 mg by mouth daily.    . Melatonin 1  MG TABS Take 3 mg by mouth at bedtime as needed.    Marland Kitchen. QUEtiapine (SEROQUEL) 25 MG tablet Take 25 mg by mouth at bedtime.    . SUMAtriptan (IMITREX) 100 MG tablet Take 100 mg by mouth every 2 (two) hours as needed for migraine. May repeat in 2 hours if headache persists or recurs.    . Vilazodone HCl (VIIBRYD) 40 MG TABS Take 40 mg by mouth daily.     No current facility-administered medications on file prior to visit.     PAST MEDICAL HISTORY: Past Medical History:  Diagnosis Date  . Depression   . High blood pressure   . Migraines   . Sleep apnea     PAST SURGICAL HISTORY: Past Surgical History:  Procedure Laterality Date  . RHINOPLASTY  1981   Deviated Septum  . TONSILLECTOMY  1973    SOCIAL HISTORY: Social History   Tobacco Use  . Smoking status: Former Smoker    Packs/day: 1.00    Years: 10.00    Pack years: 10.00    Types: Cigarettes    Last attempt to quit: 1989    Years since quitting: 30.2  . Smokeless tobacco: Never Used  Substance Use Topics  . Alcohol use: Yes  . Drug use: Not on file    FAMILY HISTORY: Family History  Problem Relation Age of Onset  . Stroke Mother   . Cancer Mother   . Obesity Mother   .  High blood pressure Father   . High Cholesterol Father   . Heart disease Father   . Kidney disease Father   . Cancer Father   . Sleep apnea Father   . Liver disease Father   . Obesity Father     ROS: Review of Systems  Constitutional: Positive for malaise/fatigue and weight loss.  Gastrointestinal: Negative for nausea and vomiting.  Genitourinary: Negative for frequency.  Musculoskeletal:       Negative muscle weakness  Endo/Heme/Allergies: Negative for polydipsia.       Negative polyphagia Negative hypoglycemia    PHYSICAL EXAM: Blood pressure 135/88, pulse (!) 117, temperature 98.6 F (37 C), temperature source Oral, height 5\' 4"  (1.626 m), weight 253 lb (114.8 kg), SpO2 98 %. Body mass index is 43.43 kg/m. Physical Exam    Constitutional: She is oriented to person, place, and time. She appears well-developed and well-nourished.  Cardiovascular: Normal rate.  Pulmonary/Chest: Effort normal.  Musculoskeletal: Normal range of motion.  Neurological: She is oriented to person, place, and time.  Skin: Skin is warm and dry.  Psychiatric: She has a normal mood and affect. Her behavior is normal.  Vitals reviewed.   RECENT LABS AND TESTS: BMET    Component Value Date/Time   NA 142 09/16/2017 0942   K 5.0 09/16/2017 0942   CL 104 09/16/2017 0942   CO2 24 09/16/2017 0942   GLUCOSE 102 (H) 09/16/2017 0942   BUN 12 09/16/2017 0942   CREATININE 0.81 09/16/2017 0942   CALCIUM 9.2 09/16/2017 0942   GFRNONAA 81 09/16/2017 0942   GFRAA 93 09/16/2017 0942   Lab Results  Component Value Date   HGBA1C 5.1 09/16/2017   Lab Results  Component Value Date   INSULIN 16.2 09/16/2017   CBC    Component Value Date/Time   WBC 6.0 09/16/2017 0942   RBC 4.54 09/16/2017 0942   HGB 13.3 09/16/2017 0942   HCT 39.8 09/16/2017 0942   MCV 88 09/16/2017 0942   MCH 29.3 09/16/2017 0942   MCHC 33.4 09/16/2017 0942   RDW 13.6 09/16/2017 0942   LYMPHSABS 1.8 09/16/2017 0942   EOSABS 0.2 09/16/2017 0942   BASOSABS 0.0 09/16/2017 0942   Iron/TIBC/Ferritin/ %Sat No results found for: IRON, TIBC, FERRITIN, IRONPCTSAT Lipid Panel     Component Value Date/Time   CHOL 204 (H) 09/16/2017 0942   TRIG 94 09/16/2017 0942   HDL 58 09/16/2017 0942   LDLCALC 127 (H) 09/16/2017 0942   Hepatic Function Panel     Component Value Date/Time   PROT 6.4 09/16/2017 0942   ALBUMIN 4.6 09/16/2017 0942   AST 11 09/16/2017 0942   ALT 11 09/16/2017 0942   ALKPHOS 76 09/16/2017 0942   BILITOT 0.5 09/16/2017 0942      Component Value Date/Time   TSH 3.450 09/16/2017 0942  Results for Mary Larsen (MRN [id number]) as of 10/04/2017 07:27  Ref. Range 09/16/2017 09:42  Vitamin D, 25-Hydroxy Latest Ref Range: 30.0 - 100.0 ng/mL 26.0 (L)     ASSESSMENT AND PLAN: Vitamin D deficiency  Prediabetes  At risk for diabetes mellitus  Class 3 severe obesity with serious comorbidity and body mass index (BMI) of 40.0 to 44.9 in adult, unspecified obesity type (HCC)  PLAN:  Vitamin D Deficiency Mary Larsen was informed that low vitamin D levels contributes to fatigue and are associated with obesity, breast, and colon cancer. Mary Larsen agrees to start prescription Vit D @50 ,000 IU every week #4 with no refills. She will  follow up for routine testing of vitamin D, at least 2-3 times per year. She was informed of the risk of over-replacement of vitamin D and agrees to not increase her dose unless she discusses this with [place] first. We will recheck labs in 3 months and Mary Larsen agrees to follow up with our clinic in 2 weeks.  Pre-Diabetes Mary Larsen will continue to work on weight loss, diet, exercise, and decreasing simple carbohydrates in her diet to help decrease the risk of diabetes. We dicussed metformin including benefits and risks. She was informed that eating too many simple carbohydrates or too many calories at one sitting increases the likelihood of GI side effects. Mary Larsen declined metformin for now and a prescription was not written today. We will recheck labs in 3 months and Mary Larsen agrees to follow up with our clinic in 2 weeks as directed to monitor her progress.  Diabetes risk counselling Mary Larsen was given extended (30 minutes) diabetes prevention counseling today. She is 57 y.o. female and has risk factors for diabetes including obesity and pre-diabetes. We discussed intensive lifestyle modifications today with an emphasis on weight loss as well as increasing exercise and decreasing simple carbohydrates in her diet.  Obesity Mary Larsen is currently in the action stage of change. As such, her goal is to continue with weight loss efforts She has agreed to follow the Category 3 plan Mary Larsen has been instructed to work up to a goal of 150 minutes of  combined cardio and strengthening exercise per week or start water aerobics 2 times per week for weight loss and overall health benefits. We discussed the following Behavioral Modification Strategies today: increasing lean protein intake and decreasing simple carbohydrates    Mary Larsen has agreed to follow up with our clinic in 2 weeks. She was informed of the importance of frequent follow up visits to maximize her success with intensive lifestyle modifications for her multiple health conditions.   OBESITY BEHAVIORAL INTERVENTION VISIT  Today's visit was # 2 out of 22.  Starting weight: 262 lbs Starting date: 09/16/17 Today's weight : 253 lbs Today's date: 09/30/2017 Total lbs lost to date: 9 (Patients must lose 7 lbs in the first 6 months to continue with counseling)   ASK: We discussed the diagnosis of obesity with Mary Larsen today and Mary Larsen agreed to give [place] permission to discuss obesity behavioral modification therapy today.  ASSESS: Mary Larsen has the diagnosis of obesity and her BMI today is 43.41 Mary Larsen is in the action stage of change   ADVISE: Mary Larsen was educated on the multiple health risks of obesity as well as the benefit of weight loss to improve her health. She was advised of the need for long term treatment and the importance of lifestyle modifications.  AGREE: Multiple dietary modification options and treatment options were discussed and  Mary Larsen agreed to the above obesity treatment plan.  I, Burt Knack, am acting as transcriptionist for Quillian Quince, MD  I have reviewed the above documentation for accuracy and completeness, and I agree with the above. -Quillian Quince, MD

## 2017-10-14 ENCOUNTER — Ambulatory Visit (INDEPENDENT_AMBULATORY_CARE_PROVIDER_SITE_OTHER): Payer: BC Managed Care – PPO | Admitting: Family Medicine

## 2017-10-14 VITALS — Temp 98.3°F | Ht 64.0 in | Wt 251.0 lb

## 2017-10-14 DIAGNOSIS — Z6841 Body Mass Index (BMI) 40.0 and over, adult: Secondary | ICD-10-CM

## 2017-10-14 DIAGNOSIS — E559 Vitamin D deficiency, unspecified: Secondary | ICD-10-CM

## 2017-10-14 DIAGNOSIS — E860 Dehydration: Secondary | ICD-10-CM

## 2017-10-17 NOTE — Progress Notes (Signed)
Office: [phone]  /  Fax: [phone]   HPI:   Chief Complaint: OBESITY Mary Larsen is here to discuss her progress with her obesity treatment plan. She is on the Category 3 plan and is following her eating plan approximately 75-80 % of the time. She states she is exercising 0 minutes 0 times per week. Mary Larsen continues to lose weight on Category 3. She didn't like the bread and would like to discuss other options. She is disappointed that she didn't lose more weight.  Her weight is 251 lb (113.9 kg) today and has had a weight loss of 2 pounds over a period of 2 weeks since her last visit. She has lost 11 lbs since starting treatment with us.  Vitamin D Deficiency Mary Larsen has a diagnosis of vitamin D deficiency. She started prescription Vit D, no GI upset. She denies nausea, vomiting or muscle weakness.  Dehydration Mary Larsen's urine was dark recently and she is feeling increased fatigue. Her blood pressure is stable. ALLERGIES: Allergies  Allergen Reactions  . Penicillins   . Sulfa Antibiotics     MEDICATIONS: Current Outpatient Medications on File Prior to Visit  Medication Sig Dispense Refill  . amphetamine-dextroamphetamine (ADDERALL XR) 20 MG 24 hr capsule Take 20 mg by mouth daily.    . bifidobacterium infantis (ALIGN) capsule Take 1 capsule by mouth daily.    Marland Kitchen. buPROPion (WELLBUTRIN XL) 300 MG 24 hr tablet Take 300 mg by mouth daily.    . cloNIDine (CATAPRES) 0.1 MG tablet Take 0.1 mg by mouth daily.    . Melatonin 1 MG TABS Take 3 mg by mouth at bedtime as needed.    Marland Kitchen. QUEtiapine (SEROQUEL) 25 MG tablet Take 25 mg by mouth at bedtime.    . SUMAtriptan (IMITREX) 100 MG tablet Take 100 mg by mouth every 2 (two) hours as needed for migraine. May repeat in 2 hours if headache persists or recurs.    . Vilazodone HCl (VIIBRYD) 40 MG TABS Take 40 mg by mouth daily.    . Vitamin D, Ergocalciferol, (DRISDOL) 50000 units CAPS capsule Take 1 capsule (50,000 Units total) by mouth every 7  (seven) days. 4 capsule 0   No current facility-administered medications on file prior to visit.     PAST MEDICAL HISTORY: Past Medical History:  Diagnosis Date  . Depression   . High blood pressure   . Migraines   . Sleep apnea     PAST SURGICAL HISTORY: Past Surgical History:  Procedure Laterality Date  . RHINOPLASTY  1981   Deviated Septum  . TONSILLECTOMY  1973    SOCIAL HISTORY: Social History   Tobacco Use  . Smoking status: Former Smoker    Packs/day: 1.00    Years: 10.00    Pack years: 10.00    Types: Cigarettes    Last attempt to quit: 1989    Years since quitting: 30.3  . Smokeless tobacco: Never Used  Substance Use Topics  . Alcohol use: Yes  . Drug use: Not on file    FAMILY HISTORY: Family History  Problem Relation Age of Onset  . Stroke Mother   . Cancer Mother   . Obesity Mother   . High blood pressure Father   . High Cholesterol Father   . Heart disease Father   . Kidney disease Father   . Cancer Father   . Sleep apnea Father   . Liver disease Father   . Obesity Father     ROS: Review of Systems  Constitutional: Positive for malaise/fatigue and weight loss.  Gastrointestinal: Negative for nausea and vomiting.  Musculoskeletal:       Negative muscle weakness    PHYSICAL EXAM: Temperature 98.3 F (36.8 C), temperature source Oral, height 5\' 4"  (1.626 m), weight 251 lb (113.9 kg). Body mass index is 43.08 kg/m. Physical Exam  Constitutional: She is oriented to person, place, and time. She appears well-developed and well-nourished.  Cardiovascular: Normal rate.  Pulmonary/Chest: Effort normal.  Musculoskeletal: Normal range of motion.  Neurological: She is oriented to person, place, and time.  Skin: Skin is warm and dry.  Psychiatric: She has a normal mood and affect. Her behavior is normal.  Vitals reviewed.   RECENT LABS AND TESTS: BMET    Component Value Date/Time   NA 142 09/16/2017 0942   K 5.0 09/16/2017 0942   CL  104 09/16/2017 0942   CO2 24 09/16/2017 0942   GLUCOSE 102 (H) 09/16/2017 0942   BUN 12 09/16/2017 0942   CREATININE 0.81 09/16/2017 0942   CALCIUM 9.2 09/16/2017 0942   GFRNONAA 81 09/16/2017 0942   GFRAA 93 09/16/2017 0942   Lab Results  Component Value Date   HGBA1C 5.1 09/16/2017   Lab Results  Component Value Date   INSULIN 16.2 09/16/2017   CBC    Component Value Date/Time   WBC 6.0 09/16/2017 0942   RBC 4.54 09/16/2017 0942   HGB 13.3 09/16/2017 0942   HCT 39.8 09/16/2017 0942   MCV 88 09/16/2017 0942   MCH 29.3 09/16/2017 0942   MCHC 33.4 09/16/2017 0942   RDW 13.6 09/16/2017 0942   LYMPHSABS 1.8 09/16/2017 0942   EOSABS 0.2 09/16/2017 0942   BASOSABS 0.0 09/16/2017 0942   Iron/TIBC/Ferritin/ %Sat No results found for: IRON, TIBC, FERRITIN, IRONPCTSAT Lipid Panel     Component Value Date/Time   CHOL 204 (H) 09/16/2017 0942   TRIG 94 09/16/2017 0942   HDL 58 09/16/2017 0942   LDLCALC 127 (H) 09/16/2017 0942   Hepatic Function Panel     Component Value Date/Time   PROT 6.4 09/16/2017 0942   ALBUMIN 4.6 09/16/2017 0942   AST 11 09/16/2017 0942   ALT 11 09/16/2017 0942   ALKPHOS 76 09/16/2017 0942   BILITOT 0.5 09/16/2017 0942      Component Value Date/Time   TSH 3.450 09/16/2017 0942  Results for ERINNE, GILLENTINE (MRN [id number]) as of 10/17/2017 16:29  Ref. Range 09/16/2017 09:42  Vitamin D, 25-Hydroxy Latest Ref Range: 30.0 - 100.0 ng/mL 26.0 (L)    ASSESSMENT AND PLAN: Vitamin D deficiency  Dehydration  Class 3 severe obesity with serious comorbidity and body mass index (BMI) of 40.0 to 44.9 in adult, unspecified obesity type (HCC)  PLAN:  Vitamin D Deficiency Mary Larsen was informed that low vitamin D levels contributes to fatigue and are associated with obesity, breast, and colon cancer. Mary Larsen agrees to continue taking prescription Vit D @50 ,000 IU every week and will follow up for routine testing of vitamin D, at least 2-3 times per year. She  was informed of the risk of over-replacement of vitamin D and agrees to not increase her dose unless she discusses this with [place] first. We will recheck labs and Mary Larsen agrees to follow up with our clinic in 2 to 3 weeks.  Dehydration Mary Larsen is to increase H20 until urine is almost clear, at least 80 oz per day. Mary Larsen agrees to follow up with our clinic in 2 to 3 weeks.  We spent >  than 50% of the 30 minute visit on the counseling as documented in the note.  Obesity Mary Larsen is currently in the action stage of change. As such, her goal is to continue with weight loss efforts She has agreed to follow the Category 3 plan Mary Larsen has been instructed to work up to a goal of 150 minutes of combined cardio and strengthening exercise per week for weight loss and overall health benefits. We discussed the following Behavioral Modification Strategies today: increasing lean protein intake, decreasing simple carbohydrates, work on meal planning and easy cooking plans, and increase H20 intake Mary Larsen was advised she is still doing well and is losing weight at a healthy rate. We discussed options like 50 calorie wrap options.  Mary Larsen has agreed to follow up with our clinic in 2 to 3 weeks. She was informed of the importance of frequent follow up visits to maximize her success with intensive lifestyle modifications for her multiple health conditions.   OBESITY BEHAVIORAL INTERVENTION VISIT  Today's visit was # 3 out of 22.  Starting weight: 262 lbs Starting date: 09/16/17 Today's weight : 251 lbs  Today's date: 10/14/2017 Total lbs lost to date: 36 (Patients must lose 7 lbs in the first 6 months to continue with counseling)   ASK: We discussed the diagnosis of obesity with Mary Larsen today and Mary Larsen agreed to give [place] permission to discuss obesity behavioral modification therapy today.  ASSESS: Mary Larsen has the diagnosis of obesity and her BMI today is 43.06 Mary Larsen is in the action stage of change    ADVISE: Baily was educated on the multiple health risks of obesity as well as the benefit of weight loss to improve her health. She was advised of the need for long term treatment and the importance of lifestyle modifications.  AGREE: Multiple dietary modification options and treatment options were discussed and  Mary Larsen agreed to the above obesity treatment plan.  I, Burt Knack, am acting as transcriptionist for Quillian Quince, MD  I have reviewed the above documentation for accuracy and completeness, and I agree with the above. -Quillian Quince, MD

## 2017-10-24 ENCOUNTER — Other Ambulatory Visit (INDEPENDENT_AMBULATORY_CARE_PROVIDER_SITE_OTHER): Payer: Self-pay | Admitting: Family Medicine

## 2017-11-02 ENCOUNTER — Ambulatory Visit (INDEPENDENT_AMBULATORY_CARE_PROVIDER_SITE_OTHER): Payer: BC Managed Care – PPO | Admitting: Family Medicine

## 2017-11-02 VITALS — BP 116/79 | HR 94 | Temp 98.5°F | Ht 64.0 in | Wt 250.0 lb

## 2017-11-02 DIAGNOSIS — E559 Vitamin D deficiency, unspecified: Secondary | ICD-10-CM

## 2017-11-02 DIAGNOSIS — Z9189 Other specified personal risk factors, not elsewhere classified: Secondary | ICD-10-CM

## 2017-11-02 DIAGNOSIS — Z6841 Body Mass Index (BMI) 40.0 and over, adult: Secondary | ICD-10-CM

## 2017-11-02 MED ORDER — VITAMIN D (ERGOCALCIFEROL) 1.25 MG (50000 UNIT) PO CAPS: 50000.0000 [IU] | ORAL_CAPSULE | ORAL | 0 refills | Status: DC

## 2017-11-04 NOTE — Progress Notes (Signed)
Office: [phone]  /  Fax: [phone]   HPI:   Chief Complaint: OBESITY Mary Larsen is here to discuss her progress with her obesity treatment plan. She is on the Category 3 plan and is following her eating plan approximately 90+ % of the time. She states she is doing 4,000 steps a day, 4 days per week. Mary Larsen continues to do well with the category 3 plan and weight loss. She is retaining fluid today. Mary Larsen is being more mindful of her food choices and is being supported by her husband, who is eating healthy with her. Her weight is 250 lb (113.4 kg) today and has had a weight loss of 1 pound over a period of 2 to 3 weeks since her last visit. She has lost 12 lbs since starting treatment with [place].  Vitamin D deficiency Mary Larsen has a diagnosis of vitamin D deficiency. Mary Larsen is stable on vit D prescription, but she is not yet at goal. Mary Larsen denies nausea, vomiting or muscle weakness.  At risk for osteopenia and osteoporosis Mary Larsen is at higher risk of osteopenia and osteoporosis due to vitamin D deficiency.   ALLERGIES: Allergies  Allergen Reactions  . Penicillins   . Sulfa Antibiotics     MEDICATIONS: Current Outpatient Medications on File Prior to Visit  Medication Sig Dispense Refill  . amphetamine-dextroamphetamine (ADDERALL XR) 20 MG 24 hr capsule Take 20 mg by mouth daily.    . bifidobacterium infantis (ALIGN) capsule Take 1 capsule by mouth daily.    Marland Kitchen buPROPion (WELLBUTRIN XL) 300 MG 24 hr tablet Take 300 mg by mouth daily.    . cloNIDine (CATAPRES) 0.1 MG tablet Take 0.1 mg by mouth daily.    . Melatonin 1 MG TABS Take 3 mg by mouth at bedtime as needed.    Marland Kitchen QUEtiapine (SEROQUEL) 25 MG tablet Take 25 mg by mouth at bedtime. Take 1-2 tablets by mouth at bedtime    . SUMAtriptan (IMITREX) 100 MG tablet Take 100 mg by mouth every 2 (two) hours as needed for migraine. May repeat in 2 hours if headache persists or recurs.    . Vilazodone HCl (VIIBRYD) 40 MG TABS Take 40 mg by  mouth daily.     No current facility-administered medications on file prior to visit.     PAST MEDICAL HISTORY: Past Medical History:  Diagnosis Date  . Depression   . High blood pressure   . Migraines   . Sleep apnea     PAST SURGICAL HISTORY: Past Surgical History:  Procedure Laterality Date  . RHINOPLASTY  1981   Deviated Septum  . TONSILLECTOMY  1973    SOCIAL HISTORY: Social History   Tobacco Use  . Smoking status: Former Smoker    Packs/day: 1.00    Years: 10.00    Pack years: 10.00    Types: Cigarettes    Last attempt to quit: 1989    Years since quitting: 30.3  . Smokeless tobacco: Never Used  Substance Use Topics  . Alcohol use: Yes  . Drug use: Not on file    FAMILY HISTORY: Family History  Problem Relation Age of Onset  . Stroke Mother   . Cancer Mother   . Obesity Mother   . High blood pressure Father   . High Cholesterol Father   . Heart disease Father   . Kidney disease Father   . Cancer Father   . Sleep apnea Father   . Liver disease Father   . Obesity Father  ROS: Review of Systems  Constitutional: Positive for weight loss.  Gastrointestinal: Negative for nausea and vomiting.  Musculoskeletal:       Negative for muscle weakness    PHYSICAL EXAM: Blood pressure 116/79, pulse 94, temperature 98.5 F (36.9 C), temperature source Oral, height  (1.626 m), weight 250 lb (113.4 kg), SpO2 98 %. Body mass index is 42.91 kg/m. Physical Exam  Constitutional: She is oriented to person, place, and time. She appears well-developed and well-nourished.  Cardiovascular: Normal rate.  Pulmonary/Chest: Effort normal.  Musculoskeletal: Normal range of motion.  Neurological: She is oriented to person, place, and time.  Skin: Skin is warm and dry.  Psychiatric: She has a normal mood and affect. Her behavior is normal.  Vitals reviewed.   RECENT LABS AND TESTS: BMET    Component Value Date/Time   NA 142 09/16/2017 0942   K 5.0  09/16/2017 0942   CL 104 09/16/2017 0942   CO2 24 09/16/2017 0942   GLUCOSE 102 (H) 09/16/2017 0942   BUN 12 09/16/2017 0942   CREATININE 0.81 09/16/2017 0942   CALCIUM 9.2 09/16/2017 0942   GFRNONAA 81 09/16/2017 0942   GFRAA 93 09/16/2017 0942   Lab Results  Component Value Date   HGBA1C 5.1 09/16/2017   Lab Results  Component Value Date   INSULIN 16.2 09/16/2017   CBC    Component Value Date/Time   WBC 6.0 09/16/2017 0942   RBC 4.54 09/16/2017 0942   HGB 13.3 09/16/2017 0942   HCT 39.8 09/16/2017 0942   MCV 88 09/16/2017 0942   MCH 29.3 09/16/2017 0942   MCHC 33.4 09/16/2017 0942   RDW 13.6 09/16/2017 0942   LYMPHSABS 1.8 09/16/2017 0942   EOSABS 0.2 09/16/2017 0942   BASOSABS 0.0 09/16/2017 0942   Iron/TIBC/Ferritin/ %Sat No results found for: IRON, TIBC, FERRITIN, IRONPCTSAT Lipid Panel     Component Value Date/Time   CHOL 204 (H) 09/16/2017 0942   TRIG 94 09/16/2017 0942   HDL 58 09/16/2017 0942   LDLCALC 127 (H) 09/16/2017 0942   Hepatic Function Panel     Component Value Date/Time   PROT 6.4 09/16/2017 0942   ALBUMIN 4.6 09/16/2017 0942   AST 11 09/16/2017 0942   ALT 11 09/16/2017 0942   ALKPHOS 76 09/16/2017 0942   BILITOT 0.5 09/16/2017 0942      Component Value Date/Time   TSH 3.450 09/16/2017 0942   Results for LEDIA, HANFORD (MRN [id number]) as of 11/04/2017 10:33  Ref. Range 09/16/2017 09:42  Vitamin D, 25-Hydroxy Latest Ref Range: 30.0 - 100.0 ng/mL 26.0 (L)   ASSESSMENT AND PLAN: Vitamin D deficiency  At risk for osteoporosis  Class 3 severe obesity with serious comorbidity and body mass index (BMI) of 40.0 to 44.9 in adult, unspecified obesity type (HCC)  PLAN:  Vitamin D Deficiency Mary Larsen was informed that low vitamin D levels contributes to fatigue and are associated with obesity, breast, and colon cancer. She agrees to continue to take prescription Vit D ,000 IU every week #4 with no refills and will follow up for routine  testing of vitamin D, at least 2-3 times per year. She was informed of the risk of over-replacement of vitamin D and agrees to not increase her dose unless she discusses this with [place] first. Mary Larsen agrees to follow up with our clinic in 2 to 3 weeks.  At risk for osteopenia and osteoporosis Mary Larsen is at risk for osteopenia and osteoporosis due to her vitamin D  deficiency. She was encouraged to take her vitamin D and follow her higher calcium diet and increase strengthening exercise to help strengthen her bones and decrease her risk of osteopenia and osteoporosis.  Obesity Mary Larsen is currently in the action stage of change. As such, her goal is to continue with weight loss efforts She has agreed to follow the Category 3 plan Mary Larsen has been instructed to work up to a goal of 150 minutes of combined cardio and strengthening exercise per week or continue with 4,000 steps per day for weight loss and overall health benefits. We discussed the following Behavioral Modification Strategies today: increasing lean protein intake and decreasing simple carbohydrates   Mary Larsen has agreed to follow up with our clinic in 2 to 3 weeks. She was informed of the importance of frequent follow up visits to maximize her success with intensive lifestyle modifications for her multiple health conditions.   OBESITY BEHAVIORAL INTERVENTION VISIT  Today's visit was # 4 out of 22.  Starting weight: 262 lbs Starting date: 09/16/17 Today's weight : 250 lbs  Today's date: 11/02/2017 Total lbs lost to date: 12 (Patients must lose 7 lbs in the first 6 months to continue with counseling)   ASK: We discussed the diagnosis of obesity with Mary Larsen today and Mary Larsen agreed to give [place] permission to discuss obesity behavioral modification therapy today.  ASSESS: Mary Larsen has the diagnosis of obesity and her BMI today is 11.89 Mary Larsen is in the action stage of change   ADVISE: Mary Larsen was educated on the multiple health risks of  obesity as well as the benefit of weight loss to improve her health. She was advised of the need for long term treatment and the importance of lifestyle modifications.  AGREE: Multiple dietary modification options and treatment options were discussed and  Mary Larsen agreed to the above obesity treatment plan.  I, [place] Crane, am acting as transcriptionist for Quillian Quince, MD  I have reviewed the above documentation for accuracy and completeness, and I agree with the above. -Quillian Quince, MD

## 2017-11-16 ENCOUNTER — Other Ambulatory Visit (INDEPENDENT_AMBULATORY_CARE_PROVIDER_SITE_OTHER): Payer: Self-pay | Admitting: Family Medicine

## 2017-11-18 ENCOUNTER — Other Ambulatory Visit (INDEPENDENT_AMBULATORY_CARE_PROVIDER_SITE_OTHER): Payer: Self-pay | Admitting: Family Medicine

## 2017-11-24 ENCOUNTER — Ambulatory Visit (INDEPENDENT_AMBULATORY_CARE_PROVIDER_SITE_OTHER): Payer: BC Managed Care – PPO | Admitting: Family Medicine

## 2017-11-24 VITALS — BP 115/78 | HR 88 | Temp 98.0°F | Ht 64.0 in | Wt 251.0 lb

## 2017-11-24 DIAGNOSIS — E559 Vitamin D deficiency, unspecified: Secondary | ICD-10-CM | POA: Diagnosis not present

## 2017-11-24 DIAGNOSIS — Z6841 Body Mass Index (BMI) 40.0 and over, adult: Secondary | ICD-10-CM | POA: Diagnosis not present

## 2017-11-24 DIAGNOSIS — Z9189 Other specified personal risk factors, not elsewhere classified: Secondary | ICD-10-CM | POA: Diagnosis not present

## 2017-11-24 DIAGNOSIS — F3289 Other specified depressive episodes: Secondary | ICD-10-CM

## 2017-11-24 MED ORDER — BUPROPION HCL ER (SR) 150 MG PO TB12: 150.0000 mg | ORAL_TABLET | Freq: Two times a day (BID) | ORAL | 0 refills | Status: DC

## 2017-11-25 NOTE — Progress Notes (Signed)
Office: [phone]  /  Fax: [phone]   HPI:   Chief Complaint: OBESITY Mary Larsen is here to discuss her progress with her obesity treatment plan. She is on the Category 3 plan and is following her eating plan approximately 40 % of the time. She states she is walking 5,000 steps 5 days a week. Mary Larsen is walking most days but had increase boredom eating and increase temptations and some sabotage by husband. She is struggling with cravings more in the PM.  Her weight is 251 lb (113.9 kg) today and has gained 1 pound since her last visit. She has lost 11 lbs since starting treatment with [place].  Vitamin D Deficiency Mary Larsen has a diagnosis of vitamin D deficiency. She is stable on prescription Vit D, not yet at goal. She denies nausea, vomiting or muscle weakness.  At risk for osteopenia and osteoporosis Mary Larsen is at higher risk of osteopenia and osteoporosis due to vitamin D deficiency.   Depression with emotional eating behaviors Mary Larsen is on multiple medications and followed by her Psychiatrist. She feels her mood is good but still struggling with emotional eating. Mary Larsen struggles with emotional eating and using food for comfort to the extent that it is negatively impacting her health. She often snacks when she is not hungry. Mary Larsen sometimes feels she is out of control and then feels guilty that she made poor food choices. She has been working on behavior modification techniques to help reduce her emotional eating and has been somewhat successful. She shows no sign of suicidal or homicidal ideations.  Depression screen PHQ 2/9 09/16/2017  Decreased Interest 3  Down, Depressed, Hopeless 3  PHQ - 2 Score 6  Altered sleeping 3  Tired, decreased energy 3  Change in appetite 2  Feeling bad or failure about yourself  3  Trouble concentrating 3  Moving slowly or fidgety/restless 3  Suicidal thoughts 0  PHQ-9 Score 23  Difficult doing work/chores Extremely dIfficult     ALLERGIES: Allergies  Allergen Reactions  . Penicillins   . Sulfa Antibiotics     MEDICATIONS: Current Outpatient Medications on File Prior to Visit  Medication Sig Dispense Refill  . amphetamine-dextroamphetamine (ADDERALL XR) 20 MG 24 hr capsule Take 20 mg by mouth daily.    . bifidobacterium infantis (ALIGN) capsule Take 1 capsule by mouth daily.    . cloNIDine (CATAPRES) 0.1 MG tablet Take 0.1 mg by mouth daily.    . Melatonin 1 MG TABS Take 3 mg by mouth at bedtime as needed.    Marland Kitchen QUEtiapine (SEROQUEL) 25 MG tablet Take 25 mg by mouth at bedtime. Take 1-2 tablets by mouth at bedtime    . SUMAtriptan (IMITREX) 100 MG tablet Take 100 mg by mouth every 2 (two) hours as needed for migraine. May repeat in 2 hours if headache persists or recurs.    . Vilazodone HCl (VIIBRYD) 40 MG TABS Take 40 mg by mouth daily.    . Vitamin D, Ergocalciferol, (DRISDOL) 50000 units CAPS capsule Take 1 capsule (50,000 Units total) by mouth every 7 (seven) days. 4 capsule 0   No current facility-administered medications on file prior to visit.     PAST MEDICAL HISTORY: Past Medical History:  Diagnosis Date  . Depression   . High blood pressure   . Migraines   . Sleep apnea     PAST SURGICAL HISTORY: Past Surgical History:  Procedure Laterality Date  . RHINOPLASTY  1981   Deviated Septum  . TONSILLECTOMY  1973  SOCIAL HISTORY: Social History   Tobacco Use  . Smoking status: Former Smoker    Packs/day: 1.00    Years: 10.00    Pack years: 10.00    Types: Cigarettes    Last attempt to quit: 1989    Years since quitting: 30.4  . Smokeless tobacco: Never Used  Substance Use Topics  . Alcohol use: Yes  . Drug use: Not on file    FAMILY HISTORY: Family History  Problem Relation Age of Onset  . Stroke Mother   . Cancer Mother   . Obesity Mother   . High blood pressure Father   . High Cholesterol Father   . Heart disease Father   . Kidney disease Father   . Cancer Father    . Sleep apnea Father   . Liver disease Father   . Obesity Father     ROS: Review of Systems  Constitutional: Negative for weight loss.  Gastrointestinal: Negative for nausea and vomiting.  Musculoskeletal:       Negative muscle weakness  Psychiatric/Behavioral: Positive for depression. Negative for suicidal ideas.    PHYSICAL EXAM: Blood pressure 115/78, pulse 88, temperature 98 F (36.7 C), temperature source Oral, height  (1.626 m), weight 251 lb (113.9 kg), SpO2 99 %. Body mass index is 43.08 kg/m. Physical Exam  Constitutional: She is oriented to person, place, and time. She appears well-developed and well-nourished.  Cardiovascular: Normal rate.  Pulmonary/Chest: Effort normal.  Musculoskeletal: Normal range of motion.  Neurological: She is oriented to person, place, and time.  Skin: Skin is warm and dry.  Psychiatric: She has a normal mood and affect. Her behavior is normal.  Vitals reviewed.   RECENT LABS AND TESTS: BMET    Component Value Date/Time   NA 142 09/16/2017 0942   K 5.0 09/16/2017 0942   CL 104 09/16/2017 0942   CO2 24 09/16/2017 0942   GLUCOSE 102 (H) 09/16/2017 0942   BUN 12 09/16/2017 0942   CREATININE 0.81 09/16/2017 0942   CALCIUM 9.2 09/16/2017 0942   GFRNONAA 81 09/16/2017 0942   GFRAA 93 09/16/2017 0942   Lab Results  Component Value Date   HGBA1C 5.1 09/16/2017   Lab Results  Component Value Date   INSULIN 16.2 09/16/2017   CBC    Component Value Date/Time   WBC 6.0 09/16/2017 0942   RBC 4.54 09/16/2017 0942   HGB 13.3 09/16/2017 0942   HCT 39.8 09/16/2017 0942   MCV 88 09/16/2017 0942   MCH 29.3 09/16/2017 0942   MCHC 33.4 09/16/2017 0942   RDW 13.6 09/16/2017 0942   LYMPHSABS 1.8 09/16/2017 0942   EOSABS 0.2 09/16/2017 0942   BASOSABS 0.0 09/16/2017 0942   Iron/TIBC/Ferritin/ %Sat No results found for: IRON, TIBC, FERRITIN, IRONPCTSAT Lipid Panel     Component Value Date/Time   CHOL 204 (H) 09/16/2017 0942    TRIG 94 09/16/2017 0942   HDL 58 09/16/2017 0942   LDLCALC 127 (H) 09/16/2017 0942   Hepatic Function Panel     Component Value Date/Time   PROT 6.4 09/16/2017 0942   ALBUMIN 4.6 09/16/2017 0942   AST 11 09/16/2017 0942   ALT 11 09/16/2017 0942   ALKPHOS 76 09/16/2017 0942   BILITOT 0.5 09/16/2017 0942      Component Value Date/Time   TSH 3.450 09/16/2017 0942  Results for PENNYE, BEEGHLY (MRN [id number]) as of 11/25/2017 10:07  Ref. Range 09/16/2017 09:42  Vitamin D, 25-Hydroxy Latest Ref Range: 30.0 -  100.0 ng/mL 26.0 (L)    ASSESSMENT AND PLAN: Vitamin D deficiency  Other depression - with emotional eating - Plan: buPROPion (WELLBUTRIN SR) 150 MG 12 hr tablet  At risk for osteoporosis  Class 3 severe obesity with serious comorbidity and body mass index (BMI) of 40.0 to 44.9 in adult, unspecified obesity type (HCC)  PLAN:  Vitamin D Deficiency Mary Larsen was informed that low vitamin D levels contributes to fatigue and are associated with obesity, breast, and colon cancer. Mary Larsen agrees to continue taking prescription Vit D ,000 IU every week and will follow up for routine testing of vitamin D, at least 2-3 times per year. She was informed of the risk of over-replacement of vitamin D and agrees to not increase her dose unless she discusses this with [place] first. We will recheck labs in 1 month and Mary Larsen agrees to follow up with our clinic in 2 to 3 weeks.  At risk for osteopenia and osteoporosis Mary Larsen is at risk for osteopenia and osteoporsis due to her vitamin D deficiency. She was encouraged to take her vitamin D and follow her higher calcium diet and increase strengthening exercise to help strengthen her bones and decrease her risk of osteopenia and osteoporosis.  Depression with Emotional Eating Behaviors We discussed behavior modification techniques today to help Mary Larsen deal with her emotional eating and depression. Mary Larsen agrees to change Wellbutrin to SR 150 mg BID #60  with no refills, as this dose seems to do better with cravings and emotional eating versus XL. Mary Larsen agrees to follow up with our clinic in 2 to 3 weeks.  Obesity Mary Larsen is currently in the action stage of change. As such, her goal is to continue with weight loss efforts She has agreed to follow the Category 3 plan Mary Larsen has been instructed to work up to a goal of 150 minutes of combined cardio and strengthening exercise per week for weight loss and overall health benefits. We discussed the following Behavioral Modification Strategies today: increasing lean protein intake, work on meal planning and easy cooking plans, dealing with family or coworker sabotage and emotional eating strategies   Prestina has agreed to follow up with our clinic in 2 to 3 weeks. She was informed of the importance of frequent follow up visits to maximize her success with intensive lifestyle modifications for her multiple health conditions.   OBESITY BEHAVIORAL INTERVENTION VISIT  Today's visit was # 5 out of 22.  Starting weight: 262 lbs Starting date: 09/16/17 Today's weight : 251 lbs  Today's date: 11/24/2017 Total lbs lost to date: 66 (Patients must lose 7 lbs in the first 6 months to continue with counseling)   ASK: We discussed the diagnosis of obesity with Mary Larsen today and Tarrah agreed to give [place] permission to discuss obesity behavioral modification therapy today.  ASSESS: Antonieta has the diagnosis of obesity and her BMI today is 43.06 Naliya is in the action stage of change   ADVISE: Mickayla was educated on the multiple health risks of obesity as well as the benefit of weight loss to improve her health. She was advised of the need for long term treatment and the importance of lifestyle modifications.  AGREE: Multiple dietary modification options and treatment options were discussed and  Heran agreed to the above obesity treatment plan.  I, Burt Knack, am acting as transcriptionist for  Quillian Quince, MD  I have reviewed the above documentation for accuracy and completeness, and I agree with the above. -Quillian Quince, MD

## 2017-12-13 ENCOUNTER — Encounter (INDEPENDENT_AMBULATORY_CARE_PROVIDER_SITE_OTHER): Payer: Self-pay

## 2017-12-13 ENCOUNTER — Ambulatory Visit (INDEPENDENT_AMBULATORY_CARE_PROVIDER_SITE_OTHER): Payer: BC Managed Care – PPO | Admitting: Family Medicine

## 2017-12-18 ENCOUNTER — Other Ambulatory Visit (INDEPENDENT_AMBULATORY_CARE_PROVIDER_SITE_OTHER): Payer: Self-pay | Admitting: Family Medicine

## 2017-12-18 DIAGNOSIS — F3289 Other specified depressive episodes: Secondary | ICD-10-CM

## 2017-12-20 ENCOUNTER — Other Ambulatory Visit: Payer: Self-pay | Admitting: Family Medicine

## 2017-12-20 DIAGNOSIS — Z1231 Encounter for screening mammogram for malignant neoplasm of breast: Secondary | ICD-10-CM

## 2017-12-21 ENCOUNTER — Ambulatory Visit (INDEPENDENT_AMBULATORY_CARE_PROVIDER_SITE_OTHER): Payer: BC Managed Care – PPO | Admitting: Family Medicine

## 2017-12-21 VITALS — BP 115/83 | HR 98 | Temp 98.2°F | Ht 64.0 in | Wt 253.0 lb

## 2017-12-21 DIAGNOSIS — F3289 Other specified depressive episodes: Secondary | ICD-10-CM

## 2017-12-21 DIAGNOSIS — E66813 Obesity, class 3: Secondary | ICD-10-CM

## 2017-12-21 DIAGNOSIS — Z6841 Body Mass Index (BMI) 40.0 and over, adult: Secondary | ICD-10-CM | POA: Diagnosis not present

## 2017-12-21 MED ORDER — BUPROPION HCL ER (SR) 150 MG PO TB12: 150.0000 mg | ORAL_TABLET | Freq: Two times a day (BID) | ORAL | 0 refills | Status: DC

## 2017-12-21 NOTE — Progress Notes (Signed)
Office: [phone]  /  Fax: [phone]   HPI:   Chief Complaint: OBESITY Larsen Larsen is here to discuss her progress with her obesity treatment plan. She is on the Category 3 plan and is following her eating plan approximately 50 % of the time. She states she is exercising 0 minutes 0 times per week. Larsen Larsen off track last month with increased family stress and worsening depression. She started back to her plan but often skipping meals.  Her weight is 253 lb (114.8 kg) today and has gained 2 pounds since her last visit. She has lost 9 lbs since starting treatment with us.  Depression with emotional eating behaviors Larsen Larsen had an episode of worsening depression last week with son Larsen Larsen and stated she couldn't get out of bed. She is feeling better now and feels more hope for the future. Larsen Larsen struggles with emotional eating and using food for comfort to the extent that it is negatively impacting her health. She often snacks when she is not hungry. Larsen Larsen sometimes feels she is out of control and then feels guilty that she made poor food choices. She has been working on behavior modification techniques to help reduce her emotional eating and has been somewhat successful. She shows no sign of suicidal or homicidal ideations.  Depression screen PHQ 2/9 09/16/2017  Decreased Interest 3  Down, Depressed, Hopeless 3  PHQ - 2 Score 6  Altered sleeping 3  Tired, decreased energy 3  Larsen Larsen appetite 2  Feeling bad or failure about yourself  3  Trouble concentrating 3  Moving slowly or fidgety/restless 3  Suicidal thoughts 0  PHQ-9 Score 23  Difficult doing work/chores Extremely dIfficult    ALLERGIES: Allergies  Allergen Reactions  . Penicillins   . Sulfa Antibiotics     MEDICATIONS: Current Outpatient Medications on File Prior to Visit  Medication Sig Dispense Refill  . amphetamine-dextroamphetamine (ADDERALL XR) 20 MG 24 hr capsule Take 20 mg by mouth daily.    . bifidobacterium  infantis (ALIGN) capsule Take 1 capsule by mouth daily.    Marland Kitchen. buPROPion (WELLBUTRIN SR) 150 MG 12 hr tablet Take 1 tablet (150 mg total) by mouth 2 (two) times daily. 60 tablet 0  . cloNIDine (CATAPRES) 0.1 MG tablet Take 0.1 mg by mouth daily.    . Melatonin 1 MG TABS Take 3 mg by mouth at bedtime as needed.    Marland Kitchen. QUEtiapine (SEROQUEL) 25 MG tablet Take 25 mg by mouth at bedtime. Take 1-2 tablets by mouth at bedtime    . SUMAtriptan (IMITREX) 100 MG tablet Take 100 mg by mouth every 2 (two) hours as needed for migraine. Larsen Larsen 2 hours if headache persists or recurs.    . Vilazodone HCl (VIIBRYD) 40 MG TABS Take 40 mg by mouth daily.    . Vitamin D, Ergocalciferol, (DRISDOL) 50000 units CAPS capsule Take 1 capsule (50,000 Units total) by mouth every 7 (seven) days. 4 capsule 0   No current facility-administered medications on file prior to visit.     PAST MEDICAL HISTORY: Past Medical History:  Diagnosis Date  . Depression   . High blood pressure   . Migraines   . Sleep apnea     PAST SURGICAL HISTORY: Past Surgical History:  Procedure Laterality Date  . RHINOPLASTY  1981   Deviated Septum  . TONSILLECTOMY  1973    SOCIAL HISTORY: Social History   Tobacco Use  . Smoking status: Former Smoker    Packs/day:  1.00    Years: 10.00    Pack years: 10.00    Types: Cigarettes    Last attempt to quit: 1989    Years since quitting: 30.4  . Smokeless tobacco: Never Used  Substance Use Topics  . Alcohol use: Yes  . Drug use: Not on file    FAMILY HISTORY: Family History  Problem Relation Age of Onset  . Stroke Mother   . Cancer Mother   . Obesity Mother   . High blood pressure Father   . High Cholesterol Father   . Heart disease Father   . Kidney disease Father   . Cancer Father   . Sleep apnea Father   . Liver disease Father   . Obesity Father     ROS: Review of Systems  Constitutional: Negative for weight loss.  Psychiatric/Behavioral: Positive for  depression. Negative for suicidal ideas.    PHYSICAL EXAM: Blood pressure 115/83, pulse 98, temperature 98.2 F (36.8 C), temperature source Oral, height 5\' 4"  (1.626 m), weight 253 lb (114.8 kg), SpO2 98 %. Body mass index is 43.43 kg/m. Physical Exam  Constitutional: She is oriented to person, place, and time. She appears well-developed and well-nourished.  Cardiovascular: Normal rate.  Pulmonary/Chest: Effort normal.  Musculoskeletal: Normal range of motion.  Neurological: She is oriented to person, place, and time.  Skin: Skin is warm and dry.  Psychiatric: She has a normal mood and affect. Her behavior is normal.  Vitals reviewed.   RECENT LABS AND TESTS: BMET    Component Value Date/Time   NA 142 09/16/2017 0942   K 5.0 09/16/2017 0942   CL 104 09/16/2017 0942   CO2 24 09/16/2017 0942   GLUCOSE 102 (H) 09/16/2017 0942   BUN 12 09/16/2017 0942   CREATININE 0.81 09/16/2017 0942   CALCIUM 9.2 09/16/2017 0942   GFRNONAA 81 09/16/2017 0942   GFRAA 93 09/16/2017 0942   Lab Results  Component Value Date   HGBA1C 5.1 09/16/2017   Lab Results  Component Value Date   INSULIN 16.2 09/16/2017   CBC    Component Value Date/Time   WBC 6.0 09/16/2017 0942   RBC 4.54 09/16/2017 0942   HGB 13.3 09/16/2017 0942   HCT 39.8 09/16/2017 0942   MCV 88 09/16/2017 0942   MCH 29.3 09/16/2017 0942   MCHC 33.4 09/16/2017 0942   RDW 13.6 09/16/2017 0942   LYMPHSABS 1.8 09/16/2017 0942   EOSABS 0.2 09/16/2017 0942   BASOSABS 0.0 09/16/2017 0942   Iron/TIBC/Ferritin/ %Sat No results found for: IRON, TIBC, FERRITIN, IRONPCTSAT Lipid Panel     Component Value Date/Time   CHOL 204 (H) 09/16/2017 0942   TRIG 94 09/16/2017 0942   HDL 58 09/16/2017 0942   LDLCALC 127 (H) 09/16/2017 0942   Hepatic Function Panel     Component Value Date/Time   PROT 6.4 09/16/2017 0942   ALBUMIN 4.6 09/16/2017 0942   AST 11 09/16/2017 0942   ALT 11 09/16/2017 0942   ALKPHOS 76 09/16/2017 0942    BILITOT 0.5 09/16/2017 0942      Component Value Date/Time   TSH 3.450 09/16/2017 0942    ASSESSMENT AND PLAN: Other depression - with emotional eating - Plan: buPROPion (WELLBUTRIN SR) 150 MG 12 hr tablet  Class 3 severe obesity with serious comorbidity and body mass index (BMI) of 40.0 to 44.9 Larsen adult, unspecified obesity type (HCC)  PLAN:  Depression with Emotional Eating Behaviors We discussed behavior modification techniques today to help [facility]  deal with her emotional eating and depression. Larsen Larsen agrees to continue taking Wellbutrin SR 150 mg BID #60 and we will refill for 1 month. Larsen Larsen agrees to follow up with our clinic Larsen 3 weeks.  We spent > than 50% of the 30 minute visit on the counseling as documented Larsen the note.  Obesity Larsen Larsen is currently Larsen Larsen. As such, her goal is to continue with weight loss efforts She has agreed to get back to strict Category 3 plan Larsen Larsen has been instructed to work up to a goal of 150 minutes of combined cardio and strengthening exercise per week for weight loss and overall health benefits. We discussed the following Behavioral Modification Strategies today: increasing lean protein intake, increasing vegetables, work on meal planning and easy cooking plans, and no skipping meals   Larsen Larsen has agreed to follow up with our clinic Larsen 3 weeks. She was informed of the importance of frequent follow up visits to maximize her success with intensive lifestyle modifications for her multiple health conditions.   OBESITY BEHAVIORAL INTERVENTION VISIT  Today's visit was # 6 out of 22.  Starting weight: 262 lbs Starting date: 09/16/17 Today's weight : 253 lbs Today's date: 12/21/2017 Total lbs lost to date: 9 (Patients must lose 7 lbs Larsen the first 6 months to continue with counseling)   ASK: We discussed the diagnosis of obesity with Larsen Larsen today and Larsen Larsen agreed to give [place] permission to discuss obesity  behavioral modification therapy today.  ASSESS: Larsen Larsen has the diagnosis of obesity and her BMI today is 43.41 Larsen Larsen is Larsen Larsen   ADVISE: Larsen Larsen was educated on the multiple health risks of obesity as well as the benefit of weight loss to improve her health. She was advised of the need for long term treatment and the importance of lifestyle modifications.  AGREE: Multiple dietary modification options and treatment options were discussed and  Larsen Larsen agreed to the above obesity treatment plan.  I, Burt Knack, am acting as transcriptionist for Quillian Quince, MD  I have reviewed the above documentation for accuracy and completeness, and I agree with the above. -Quillian Quince, MD

## 2017-12-28 ENCOUNTER — Other Ambulatory Visit (INDEPENDENT_AMBULATORY_CARE_PROVIDER_SITE_OTHER): Payer: Self-pay | Admitting: Family Medicine

## 2018-01-12 ENCOUNTER — Ambulatory Visit (INDEPENDENT_AMBULATORY_CARE_PROVIDER_SITE_OTHER): Payer: BC Managed Care – PPO | Admitting: Family Medicine

## 2018-01-12 VITALS — BP 109/76 | HR 89 | Temp 98.0°F | Ht 64.0 in | Wt 251.0 lb

## 2018-01-12 DIAGNOSIS — E7849 Other hyperlipidemia: Secondary | ICD-10-CM | POA: Diagnosis not present

## 2018-01-12 DIAGNOSIS — E559 Vitamin D deficiency, unspecified: Secondary | ICD-10-CM | POA: Diagnosis not present

## 2018-01-12 DIAGNOSIS — E8881 Metabolic syndrome: Secondary | ICD-10-CM | POA: Diagnosis not present

## 2018-01-12 DIAGNOSIS — Z9189 Other specified personal risk factors, not elsewhere classified: Secondary | ICD-10-CM | POA: Diagnosis not present

## 2018-01-12 DIAGNOSIS — Z6841 Body Mass Index (BMI) 40.0 and over, adult: Secondary | ICD-10-CM

## 2018-01-12 MED ORDER — VITAMIN D (ERGOCALCIFEROL) 1.25 MG (50000 UNIT) PO CAPS: 50000.0000 [IU] | ORAL_CAPSULE | ORAL | 0 refills | Status: DC

## 2018-01-12 NOTE — Progress Notes (Signed)
Office: [phone]  /  Fax: [phone]   HPI:   Chief Complaint: OBESITY Bard Mary Larsen is here to discuss her progress with her obesity treatment plan. She is on the Category 3 plan and is following her eating plan approximately 90 % of the time. She states she is walking 4,000 steps 5 days per week and at the pool. Bard Mary Larsen has done well with weight loss but reports not eating all of the food on the plan. She reports missing protein throughout the day.  Her weight is 251 lb (113.9 kg) today and has had a weight loss of 2 pounds over a period of 3 weeks since her last visit. She has lost 11 lbs since starting treatment with us.  Vitamin D Deficiency Bard Mary Larsen has a diagnosis of vitamin D deficiency. She is currently taking prescription Vit D, last level not at goal. She denies nausea, vomiting or muscle weakness.  At risk for osteopenia and osteoporosis Bard Mary Larsen is at higher risk of osteopenia and osteoporosis due to vitamin D deficiency.   Insulin Resistance Bard Mary Larsen has a diagnosis of insulin resistance based on her elevated fasting insulin level >5. Although Elias's blood glucose readings are still under good control, insulin resistance puts her at greater risk of metabolic syndrome and diabetes. She is not taking metformin currently and continues to work on diet and exercise to decrease risk of diabetes. She denies hypoglycemia or polyphagia.  Hyperlipidemia Tamina has hyperlipidemia and has been trying to improve her cholesterol levels with intensive lifestyle modification including a low saturated fat diet, exercise and weight loss. She is not on medication and denies any chest pain, claudication or myalgias.  ALLERGIES: Allergies  Allergen Reactions  . Penicillins   . Sulfa Antibiotics     MEDICATIONS: Current Outpatient Medications on File Prior to Visit  Medication Sig Dispense Refill  . amphetamine-dextroamphetamine (ADDERALL XR) 20 MG 24 hr capsule Take 20 mg by mouth daily.    .  bifidobacterium infantis (ALIGN) capsule Take 1 capsule by mouth daily.    Marland Kitchen. buPROPion (WELLBUTRIN SR) 150 MG 12 hr tablet Take 1 tablet (150 mg total) by mouth 2 (two) times daily. 60 tablet 0  . cloNIDine (CATAPRES) 0.1 MG tablet Take 0.1 mg by mouth daily.    . Melatonin 1 MG TABS Take 3 mg by mouth at bedtime as needed.    Marland Kitchen. QUEtiapine (SEROQUEL) 25 MG tablet Take 25 mg by mouth at bedtime. Take 1-2 tablets by mouth at bedtime    . SUMAtriptan (IMITREX) 100 MG tablet Take 100 mg by mouth every 2 (two) hours as needed for migraine. May repeat in 2 hours if headache persists or recurs.    . Vilazodone HCl (VIIBRYD) 40 MG TABS Take 40 mg by mouth daily.     No current facility-administered medications on file prior to visit.     PAST MEDICAL HISTORY: Past Medical History:  Diagnosis Date  . Depression   . High blood pressure   . Migraines   . Sleep apnea     PAST SURGICAL HISTORY: Past Surgical History:  Procedure Laterality Date  . RHINOPLASTY  1981   Deviated Septum  . TONSILLECTOMY  1973    SOCIAL HISTORY: Social History   Tobacco Use  . Smoking status: Former Smoker    Packs/day: 1.00    Years: 10.00    Pack years: 10.00    Types: Cigarettes    Last attempt to quit: 1989    Years since quitting: 30.5  .  Smokeless tobacco: Never Used  Substance Use Topics  . Alcohol use: Yes  . Drug use: Not on file    FAMILY HISTORY: Family History  Problem Relation Age of Onset  . Stroke Mother   . Cancer Mother   . Obesity Mother   . High blood pressure Father   . High Cholesterol Father   . Heart disease Father   . Kidney disease Father   . Cancer Father   . Sleep apnea Father   . Liver disease Father   . Obesity Father     ROS: Review of Systems  Constitutional: Positive for weight loss.  Cardiovascular: Negative for chest pain and claudication.  Gastrointestinal: Negative for nausea and vomiting.  Musculoskeletal: Negative for myalgias.       Negative  muscle weakness  Endo/Heme/Allergies:       Negative hypoglycemia Negative polyphagia    PHYSICAL EXAM: Blood pressure 109/76, pulse 89, temperature 98 F (36.7 C), temperature source Oral, height 5\' 4"  (1.626 m), weight 251 lb (113.9 kg), SpO2 97 %. Body mass index is 43.08 kg/m. Physical Exam  Constitutional: She is oriented to person, place, and time. She appears well-developed and well-nourished.  Cardiovascular: Normal rate.  Pulmonary/Chest: Effort normal.  Musculoskeletal: Normal range of motion.  Neurological: She is oriented to person, place, and time.  Skin: Skin is warm and dry.  Psychiatric: She has a normal mood and affect. Her behavior is normal.  Vitals reviewed.   RECENT LABS AND TESTS: BMET    Component Value Date/Time   NA 142 09/16/2017 0942   K 5.0 09/16/2017 0942   CL 104 09/16/2017 0942   CO2 24 09/16/2017 0942   GLUCOSE 102 (H) 09/16/2017 0942   BUN 12 09/16/2017 0942   CREATININE 0.81 09/16/2017 0942   CALCIUM 9.2 09/16/2017 0942   GFRNONAA 81 09/16/2017 0942   GFRAA 93 09/16/2017 0942   Lab Results  Component Value Date   HGBA1C 5.1 09/16/2017   Lab Results  Component Value Date   INSULIN 16.2 09/16/2017   CBC    Component Value Date/Time   WBC 6.0 09/16/2017 0942   RBC 4.54 09/16/2017 0942   HGB 13.3 09/16/2017 0942   HCT 39.8 09/16/2017 0942   MCV 88 09/16/2017 0942   MCH 29.3 09/16/2017 0942   MCHC 33.4 09/16/2017 0942   RDW 13.6 09/16/2017 0942   LYMPHSABS 1.8 09/16/2017 0942   EOSABS 0.2 09/16/2017 0942   BASOSABS 0.0 09/16/2017 0942   Iron/TIBC/Ferritin/ %Sat No results found for: IRON, TIBC, FERRITIN, IRONPCTSAT Lipid Panel     Component Value Date/Time   CHOL 204 (H) 09/16/2017 0942   TRIG 94 09/16/2017 0942   HDL 58 09/16/2017 0942   LDLCALC 127 (H) 09/16/2017 0942   Hepatic Function Panel     Component Value Date/Time   PROT 6.4 09/16/2017 0942   ALBUMIN 4.6 09/16/2017 0942   AST 11 09/16/2017 0942   ALT 11  09/16/2017 0942   ALKPHOS 76 09/16/2017 0942   BILITOT 0.5 09/16/2017 0942      Component Value Date/Time   TSH 3.450 09/16/2017 0942  Results for ALIEA, BOBE (MRN [id number]) as of 01/12/2018 16:49  Ref. Range 09/16/2017 09:42  Vitamin D, 25-Hydroxy Latest Ref Range: 30.0 - 100.0 ng/mL 26.0 (L)    ASSESSMENT AND PLAN: Vitamin D deficiency - Plan: VITAMIN D 25 Hydroxy (Vit-D Deficiency, Fractures), Vitamin D, Ergocalciferol, (DRISDOL) 50000 units CAPS capsule  Insulin resistance - Plan: Comprehensive metabolic panel,  Insulin, random  Other hyperlipidemia - Plan: Comprehensive metabolic panel, Lipid Panel With LDL/HDL Ratio  At risk for osteoporosis  Class 3 severe obesity with serious comorbidity and body mass index (BMI) of 40.0 to 44.9 in adult, unspecified obesity type (HCC)  PLAN:  Vitamin D Deficiency Arionna was informed that low vitamin D levels contributes to fatigue and are associated with obesity, breast, and colon cancer. Chela agrees to continue taking prescription Vit D @50 ,000 IU every week #4 and we will refill for 1 month. She will follow up for routine testing of vitamin D, at least 2-3 times per year. She was informed of the risk of over-replacement of vitamin D and agrees to not increase her dose unless she discusses this with [place] first. We will check labs today and Torian agrees to follow up with our clinic in 3 weeks.  At risk for osteopenia and osteoporosis Lynzie is at risk for osteopenia and osteoporsis due to her vitamin D deficiency. She was encouraged to take her vitamin D and follow her higher calcium diet and increase strengthening exercise to help strengthen her bones and decrease her risk of osteopenia and osteoporosis.  Insulin Resistance Roselyn will continue to work on weight loss, diet, exercise, and decreasing simple carbohydrates in her diet to help decrease the risk of diabetes. We dicussed metformin including benefits and risks. She was informed  that eating too many simple carbohydrates or too many calories at one sitting increases the likelihood of GI side effects. Cendy declined metformin for now and prescription was not written today. We will check labs today and Zenaida agrees to follow up with our clinic in 3 weeks as directed to monitor her progress.  Hyperlipidemia Lesieli was informed of the American Heart Association Guidelines emphasizing intensive lifestyle modifications as the first line treatment for hyperlipidemia. We discussed many lifestyle modifications today in depth, and Sharifa will continue to work on decreasing saturated fats such as fatty red meat, butter and many fried foods. She will also increase vegetables and lean protein in her diet and continue to work on diet, exercise, and weight loss efforts. We will check labs today and Zylpha agrees to follow up with our clinic in 3 weeks.  Obesity Laria is currently in the action stage of change. As such, her goal is to continue with weight loss efforts She has agreed to follow the Category 3 plan Sandrina has been instructed to work up to a goal of 150 minutes of combined cardio and strengthening exercise per week for weight loss and overall health benefits. We discussed the following Behavioral Modification Strategies today: increasing lean protein intake, no skipping meals, and work on meal planning and easy cooking plans   Gerilyn has agreed to follow up with our clinic in 3 weeks. She was informed of the importance of frequent follow up visits to maximize her success with intensive lifestyle modifications for her multiple health conditions.   OBESITY BEHAVIORAL INTERVENTION VISIT  Today's visit was # 7 out of 22.  Starting weight: 262 lbs Starting date: 09/16/17 Today's weight : 251 lbs Today's date: 01/12/2018 Total lbs lost to date: 90    ASK: We discussed the diagnosis of obesity with Marinus Maw today and Lilee agreed to give [place] permission to discuss  obesity behavioral modification therapy today.  ASSESS: Louella has the diagnosis of obesity and her BMI today is 43.06 Neftali is in the action stage of change   ADVISE: Chakia was educated on the multiple health  risks of obesity as well as the benefit of weight loss to improve her health. She was advised of the need for long term treatment and the importance of lifestyle modifications.  AGREE: Multiple dietary modification options and treatment options were discussed and  Jensine agreed to the above obesity treatment plan.  Trude Mcburney, am acting as transcriptionist for Alois Cliche, PA-C  I have reviewed the above documentation for accuracy and completeness, and I agree with the above. -Quillian Quince, MD

## 2018-01-13 ENCOUNTER — Other Ambulatory Visit (INDEPENDENT_AMBULATORY_CARE_PROVIDER_SITE_OTHER): Payer: Self-pay | Admitting: Family Medicine

## 2018-01-13 DIAGNOSIS — F3289 Other specified depressive episodes: Secondary | ICD-10-CM

## 2018-01-13 LAB — COMPREHENSIVE METABOLIC PANEL
ALT: 10 IU/L (ref 0–32)
AST: 12 IU/L (ref 0–40)
Albumin/Globulin Ratio: 1.9 (ref 1.2–2.2)
Albumin: 4.3 g/dL (ref 3.5–5.5)
Alkaline Phosphatase: 80 IU/L (ref 39–117)
BUN/Creatinine Ratio: 16 (ref 9–23)
BUN: 14 mg/dL (ref 6–24)
Bilirubin Total: 1.1 mg/dL (ref 0.0–1.2)
CO2: 24 mmol/L (ref 20–29)
Calcium: 9.3 mg/dL (ref 8.7–10.2)
Chloride: 106 mmol/L (ref 96–106)
Creatinine, Ser: 0.85 mg/dL (ref 0.57–1.00)
GFR calc Af Amer: 88 mL/min/{1.73_m2} (ref >59)
GFR calc non Af Amer: 76 mL/min/{1.73_m2} (ref >59)
Globulin, Total: 2.3 g/dL (ref 1.5–4.5)
Glucose: 100 mg/dL — ABNORMAL HIGH (ref 65–99)
Potassium: 4.5 mmol/L (ref 3.5–5.2)
Sodium: 145 mmol/L — ABNORMAL HIGH (ref 134–144)
Total Protein: 6.6 g/dL (ref 6.0–8.5)

## 2018-01-13 LAB — LIPID PANEL WITH LDL/HDL RATIO
Cholesterol, Total: 204 mg/dL — ABNORMAL HIGH (ref 100–199)
HDL: 54 mg/dL (ref >39)
LDL Calculated: 129 mg/dL — ABNORMAL HIGH (ref 0–99)
LDl/HDL Ratio: 2.4 ratio (ref 0.0–3.2)
Triglycerides: 104 mg/dL (ref 0–149)
VLDL Cholesterol Cal: 21 mg/dL (ref 5–40)

## 2018-01-13 LAB — INSULIN, RANDOM: INSULIN: 9.8 u[IU]/mL (ref 2.6–24.9)

## 2018-01-13 LAB — VITAMIN D 25 HYDROXY (VIT D DEFICIENCY, FRACTURES): Vit D, 25-Hydroxy: 29.3 ng/mL — ABNORMAL LOW (ref 30.0–100.0)

## 2018-01-18 ENCOUNTER — Inpatient Hospital Stay: Admission: RE | Admit: 2018-01-18 | Payer: BC Managed Care – PPO | Source: Ambulatory Visit

## 2018-01-19 ENCOUNTER — Other Ambulatory Visit (INDEPENDENT_AMBULATORY_CARE_PROVIDER_SITE_OTHER): Payer: Self-pay | Admitting: Family Medicine

## 2018-01-19 DIAGNOSIS — F3289 Other specified depressive episodes: Secondary | ICD-10-CM

## 2018-01-21 ENCOUNTER — Other Ambulatory Visit (INDEPENDENT_AMBULATORY_CARE_PROVIDER_SITE_OTHER): Payer: Self-pay | Admitting: Family Medicine

## 2018-01-21 DIAGNOSIS — F3289 Other specified depressive episodes: Secondary | ICD-10-CM

## 2018-02-01 ENCOUNTER — Encounter (INDEPENDENT_AMBULATORY_CARE_PROVIDER_SITE_OTHER): Payer: Self-pay

## 2018-02-01 ENCOUNTER — Ambulatory Visit (INDEPENDENT_AMBULATORY_CARE_PROVIDER_SITE_OTHER): Payer: BC Managed Care – PPO | Admitting: Family Medicine

## 2018-03-04 ENCOUNTER — Ambulatory Visit: Payer: BC Managed Care – PPO | Admitting: Adult Health

## 2018-03-06 ENCOUNTER — Other Ambulatory Visit (INDEPENDENT_AMBULATORY_CARE_PROVIDER_SITE_OTHER): Payer: Self-pay | Admitting: Family Medicine

## 2018-03-06 DIAGNOSIS — E559 Vitamin D deficiency, unspecified: Secondary | ICD-10-CM

## 2018-03-11 ENCOUNTER — Ambulatory Visit: Payer: BC Managed Care – PPO | Admitting: Adult Health

## 2019-06-05 ENCOUNTER — Emergency Department (HOSPITAL_COMMUNITY)
Admission: EM | Admit: 2019-06-05 | Discharge: 2019-06-05 | Disposition: A | Discharge status: Home / Self Care | Payer: BC Managed Care – PPO | Attending: Emergency Medicine | Admitting: Emergency Medicine

## 2019-06-05 ENCOUNTER — Emergency Department (HOSPITAL_COMMUNITY): Payer: BC Managed Care – PPO

## 2019-06-05 ENCOUNTER — Encounter (HOSPITAL_COMMUNITY): Payer: Self-pay | Admitting: Emergency Medicine

## 2019-06-05 DIAGNOSIS — Z79899 Other long term (current) drug therapy: Secondary | ICD-10-CM | POA: Diagnosis not present

## 2019-06-05 DIAGNOSIS — Z87891 Personal history of nicotine dependence: Secondary | ICD-10-CM | POA: Insufficient documentation

## 2019-06-05 DIAGNOSIS — U071 COVID-19: Secondary | ICD-10-CM | POA: Diagnosis present

## 2019-06-05 LAB — COMPREHENSIVE METABOLIC PANEL
ALT: 55 U/L — ABNORMAL HIGH (ref 0–44)
AST: 32 U/L (ref 15–41)
Albumin: 3.8 g/dL (ref 3.5–5.0)
Alkaline Phosphatase: 76 U/L (ref 38–126)
Anion gap: 7 (ref 5–15)
BUN: 13 mg/dL (ref 6–20)
CO2: 27 mmol/L (ref 22–32)
Calcium: 9.4 mg/dL (ref 8.9–10.3)
Chloride: 105 mmol/L (ref 98–111)
Creatinine, Ser: 0.8 mg/dL (ref 0.44–1.00)
GFR calc Af Amer: >60 mL/min (ref >60)
GFR calc non Af Amer: >60 mL/min (ref >60)
Glucose, Bld: 104 mg/dL — ABNORMAL HIGH (ref 70–99)
Potassium: 4 mmol/L (ref 3.5–5.1)
Sodium: 139 mmol/L (ref 135–145)
Total Bilirubin: 1.2 mg/dL (ref 0.3–1.2)
Total Protein: 6.5 g/dL (ref 6.5–8.1)

## 2019-06-05 LAB — CBC WITH DIFFERENTIAL/PLATELET
Abs Immature Granulocytes: 0.03 10*3/uL (ref 0.00–0.07)
Basophils Absolute: 0 10*3/uL (ref 0.0–0.1)
Basophils Relative: 0 %
Eosinophils Absolute: 0 10*3/uL (ref 0.0–0.5)
Eosinophils Relative: 1 %
HCT: 40.3 % (ref 36.0–46.0)
Hemoglobin: 13 g/dL (ref 12.0–15.0)
Immature Granulocytes: 1 %
Lymphocytes Relative: 23 %
Lymphs Abs: 1 10*3/uL (ref 0.7–4.0)
MCH: 28.9 pg (ref 26.0–34.0)
MCHC: 32.3 g/dL (ref 30.0–36.0)
MCV: 89.6 fL (ref 80.0–100.0)
Monocytes Absolute: 0.4 10*3/uL (ref 0.1–1.0)
Monocytes Relative: 9 %
Neutro Abs: 2.9 10*3/uL (ref 1.7–7.7)
Neutrophils Relative %: 66 %
Platelets: 217 10*3/uL (ref 150–400)
RBC: 4.5 MIL/uL (ref 3.87–5.11)
RDW: 12.1 % (ref 11.5–15.5)
WBC: 4.4 10*3/uL (ref 4.0–10.5)
nRBC: 0 % (ref 0.0–0.2)

## 2019-06-05 LAB — URINALYSIS, ROUTINE W REFLEX MICROSCOPIC
Bacteria, UA: NONE SEEN
Glucose, UA: NEGATIVE mg/dL
Hgb urine dipstick: NEGATIVE
Ketones, ur: NEGATIVE mg/dL
Leukocytes,Ua: NEGATIVE
Nitrite: NEGATIVE
Protein, ur: 30 mg/dL — AB
Specific Gravity, Urine: 1.031 — ABNORMAL HIGH (ref 1.005–1.030)
pH: 5 (ref 5.0–8.0)

## 2019-06-05 LAB — LIPASE, BLOOD: Lipase: 27 U/L (ref 11–51)

## 2019-06-05 NOTE — ED Provider Notes (Signed)
[facility] EMERGENCY DEPARTMENT Provider Note   CSN: [id number] Arrival date & time: 06/05/19  1201     History   Chief Complaint Chief Complaint  Patient presents with  . covid +    HPI Mary Larsen is a 58 y.o. female.     HPI Patient presents with concern of ongoing difficult recovery from Covid infection.  On she notes that she was diagnosed to weeks ago was Covid positive for Since that time she has had fatigue, generalized weakness, without focal pain beyond mild lower abdominal discomfort, with dysuria. No other abdominal pain, no chest pain. There is dyspnea. She was well prior to acquiring the infection, but has had a uncomfortable recovery. Notably, the patient's husband is also positive for coronavirus, and today after being discharged following his hospitalization he brought her here for evaluation. She denies confusion, disorientation, vomiting, has had diarrhea.    Past Medical History:  Diagnosis Date  . Depression   . High blood pressure   . Migraines   . Sleep apnea     Patient Active Problem List   Diagnosis Date Noted  . Other fatigue 09/16/2017  . Shortness of breath on exertion 09/16/2017  . Depression with anxiety 09/16/2017  . Obstructive sleep apnea syndrome 09/16/2017  . Morbid obesity ([place]) 10/15/2016  . OSA (obstructive sleep apnea) 08/06/2016    Past Surgical History:  Procedure Laterality Date  . RHINOPLASTY  1981   Deviated Septum  . TONSILLECTOMY  1973     OB History    Gravida  2   Para  2   Term  2   Preterm      AB      Living  2     SAB      TAB      Ectopic      Multiple      Live Births               Home Medications    Prior to Admission medications   Medication Sig Start Date End Date Taking? Authorizing Provider  amphetamine-dextroamphetamine (ADDERALL XR) 20 MG 24 hr capsule Take 20 mg by mouth daily.    [provider]  bifidobacterium infantis (ALIGN)  capsule Take 1 capsule by mouth daily.    [provider]  buPROPion (WELLBUTRIN SR) 150 MG 12 hr tablet TAKE 1 TABLET BY MOUTH TWICE A DAY 01/17/18   Dennard Nip D, MD  cloNIDine (CATAPRES) 0.1 MG tablet Take 0.1 mg by mouth daily.    [provider]  Melatonin 1 MG TABS Take 3 mg by mouth at bedtime as needed.    [provider]  QUEtiapine (SEROQUEL) 25 MG tablet Take 25 mg by mouth at bedtime. Take 1-2 tablets by mouth at bedtime    [provider]  SUMAtriptan (IMITREX) 100 MG tablet Take 100 mg by mouth every 2 (two) hours as needed for migraine. May repeat in 2 hours if headache persists or recurs.    [provider]  Vilazodone HCl (VIIBRYD) 40 MG TABS Take 40 mg by mouth daily.    [provider]  Vitamin D, Ergocalciferol, (DRISDOL) 50000 units CAPS capsule Take 1 capsule (50,000 Units total) by mouth every 7 (seven) days. 01/12/18   Starlyn Skeans, MD    Family History Family History  Problem Relation Age of Onset  . Stroke Mother   . Cancer Mother   . Obesity Mother   .  High blood pressure Father   . High Cholesterol Father   . Heart disease Father   . Kidney disease Father   . Cancer Father   . Sleep apnea Father   . Liver disease Father   . Obesity Father     Social History Social History   Tobacco Use  . Smoking status: Former Smoker    Packs/day: 1.00    Years: 10.00    Pack years: 10.00    Types: Cigarettes    Quit date: 1989    Years since quitting: 31.9  . Smokeless tobacco: Never Used  Substance Use Topics  . Alcohol use: Yes  . Drug use: Not on file     Allergies   Penicillins and Sulfa antibiotics   Review of Systems Review of Systems  Constitutional:       Per HPI, otherwise negative  HENT:       Per HPI, otherwise negative  Respiratory:       Per HPI, otherwise negative  Cardiovascular:       Per HPI, otherwise negative  Gastrointestinal: Negative for vomiting.  Endocrine:        Negative aside from HPI  Genitourinary:       Neg aside from HPI   Musculoskeletal:       Per HPI, otherwise negative  Skin: Negative.   Allergic/Immunologic: Negative for immunocompromised state.  Neurological: Negative for syncope.  Psychiatric/Behavioral:       Depression, well controlled, per patient     Physical Exam Updated Vital Signs BP 119/79   Pulse 86   Temp 97.8 F (36.6 C) (Oral)   Resp 18   SpO2 97%   Physical Exam Vitals signs and nursing note reviewed.  Constitutional:      General: She is not in acute distress.    Appearance: She is well-developed.  HENT:     Head: Normocephalic and atraumatic.  Eyes:     Conjunctiva/sclera: Conjunctivae normal.  Cardiovascular:     Rate and Rhythm: Regular rhythm. Tachycardia present.  Pulmonary:     Effort: Pulmonary effort is normal. No respiratory distress.     Breath sounds: Normal breath sounds. No stridor.  Abdominal:     General: There is no distension.  Skin:    General: Skin is warm and dry.  Neurological:     Mental Status: She is alert and oriented to person, place, and time.     Cranial Nerves: No cranial nerve deficit.  Psychiatric:        Mood and Affect: Mood normal.        Behavior: Behavior normal.      ED Treatments / Results  Labs (all labs ordered are listed, but only abnormal results are displayed) Labs Reviewed  COMPREHENSIVE METABOLIC PANEL - Abnormal; Notable for the following components:      Result Value   Glucose, Bld 104 (*)    ALT 55 (*)    All other components within normal limits  URINALYSIS, ROUTINE W REFLEX MICROSCOPIC - Abnormal; Notable for the following components:   Color, Urine AMBER (*)    Specific Gravity, Urine 1.031 (*)    Bilirubin Urine MODERATE (*)    Protein, ur 30 (*)    All other components within normal limits  LIPASE, BLOOD  CBC WITH DIFFERENTIAL/PLATELET    Radiology Dg Chest Port 1 View  Result Date: 06/05/2019 CLINICAL DATA:  Shortness of  breath, cough, congestion EXAM: PORTABLE CHEST 1 VIEW COMPARISON:  None. FINDINGS:  The heart size and mediastinal contours are within normal limits. Both lungs are clear. The visualized skeletal structures are unremarkable. IMPRESSION: No active disease. Electronically Signed   By: Elige KoHetal  Patel   On: 06/05/2019 17:22    Procedures Procedures (including critical care time)  Medications Ordered in ED Medications - No data to display   Initial Impression / Assessment and Plan / ED Course  I have reviewed the triage vital signs and the nursing notes.  Pertinent labs & imaging results that were available during my care of the patient were reviewed by me and considered in my medical decision making (see chart for details).        7:50 PM Patient is awake, alert, in no distress sitting upright speaking clearly. No tachycardia, no hemodynamic stability, no apparent discomfort. We discussed all findings including generally reassuring x-ray, labs. There are some bilirubin in her urine, suggesting likely cause for discoloration, malodorous findings, without abdominal pain, without evidence for UTI, indication for antibiotics is not there. Culture will be sent.  Given the patient's description of coronavirus diagnosis 2 weeks ago, now with no hemodynamic instability, no distress, unremarkable x-ray, there is no negation for admission, patient advised of all findings, advised for continued appropriate resuscitation at home with fluids, rest, PMD follow-up as needed.  Marinus MawDaphne S Kauer was evaluated in Emergency Department on 06/05/2019 for the symptoms described in the history of present illness. She was evaluated in the context of the global COVID-19 pandemic, which necessitated consideration that the patient might be at risk for infection with the SARS-CoV-2 virus that causes COVID-19. Institutional protocols and algorithms that pertain to the evaluation of patients at risk for COVID-19 are in a state of  rapid change based on information released by regulatory bodies including the CDC and federal and state organizations. These policies and algorithms were followed during the patient's care in the ED.   Final Clinical Impressions(s) / ED Diagnoses   Final diagnoses:  COVID-19 virus infection     Gerhard MunchLockwood, Hanny Elsberry, MD 06/05/19 1952

## 2019-06-05 NOTE — Discharge Instructions (Signed)
As discussed, your evaluation today has been largely reassuring.  But, it is important that you monitor your condition carefully, and do not hesitate to return to the ED if you develop new, or concerning changes in your condition. ? ?Otherwise, please follow-up with your physician for appropriate ongoing care. ? ?

## 2019-06-05 NOTE — ED Notes (Signed)
Patient Alert and oriented to baseline. Stable and ambulatory to baseline. Patient verbalized understanding of the discharge instructions.  Patient belongings were taken by the patient.   

## 2019-06-05 NOTE — ED Notes (Signed)
Lab to add on U/A. 

## 2019-06-05 NOTE — ED Triage Notes (Signed)
Pt arrives to ED states she is on week 2 of covid and still having cough nasal congestion_ pt states she just got her smell back recently and noticed a foul odor from vaginal area and wanted to check if she had an infection. Pt denies any discharge or pain. Pt denies any cp or sob.

## 2020-07-25 ENCOUNTER — Other Ambulatory Visit: Payer: Self-pay | Admitting: Family Medicine

## 2020-07-25 DIAGNOSIS — Z0000 Encounter for general adult medical examination without abnormal findings: Secondary | ICD-10-CM

## 2020-09-09 ENCOUNTER — Other Ambulatory Visit: Payer: Self-pay

## 2020-09-09 ENCOUNTER — Ambulatory Visit
Admission: RE | Admit: 2020-09-09 | Discharge: 2020-09-09 | Disposition: A | Discharge status: Home / Self Care | Payer: BC Managed Care – PPO | Source: Ambulatory Visit | Attending: Family Medicine | Admitting: Family Medicine

## 2020-09-09 DIAGNOSIS — Z0000 Encounter for general adult medical examination without abnormal findings: Secondary | ICD-10-CM

## 2021-12-03 ENCOUNTER — Other Ambulatory Visit: Payer: Self-pay | Admitting: Family Medicine

## 2021-12-03 DIAGNOSIS — R748 Abnormal levels of other serum enzymes: Secondary | ICD-10-CM

## 2021-12-03 DIAGNOSIS — R1011 Right upper quadrant pain: Secondary | ICD-10-CM

## 2021-12-10 ENCOUNTER — Ambulatory Visit (INDEPENDENT_AMBULATORY_CARE_PROVIDER_SITE_OTHER): Payer: BC Managed Care – PPO | Admitting: Bariatrics

## 2021-12-10 ENCOUNTER — Encounter (INDEPENDENT_AMBULATORY_CARE_PROVIDER_SITE_OTHER): Payer: Self-pay | Admitting: Bariatrics

## 2021-12-10 VITALS — BP 129/81 | HR 100 | Temp 98.0°F | Ht 65.0 in | Wt 252.0 lb

## 2021-12-10 DIAGNOSIS — E7849 Other hyperlipidemia: Secondary | ICD-10-CM | POA: Diagnosis not present

## 2021-12-10 DIAGNOSIS — Z0289 Encounter for other administrative examinations: Secondary | ICD-10-CM

## 2021-12-10 DIAGNOSIS — R5383 Other fatigue: Secondary | ICD-10-CM

## 2021-12-10 DIAGNOSIS — E559 Vitamin D deficiency, unspecified: Secondary | ICD-10-CM

## 2021-12-10 DIAGNOSIS — Z1331 Encounter for screening for depression: Secondary | ICD-10-CM | POA: Insufficient documentation

## 2021-12-10 DIAGNOSIS — Z6841 Body Mass Index (BMI) 40.0 and over, adult: Secondary | ICD-10-CM

## 2021-12-10 DIAGNOSIS — E8881 Metabolic syndrome: Secondary | ICD-10-CM

## 2021-12-10 DIAGNOSIS — R0602 Shortness of breath: Secondary | ICD-10-CM | POA: Diagnosis not present

## 2021-12-10 DIAGNOSIS — F418 Other specified anxiety disorders: Secondary | ICD-10-CM | POA: Diagnosis not present

## 2021-12-10 DIAGNOSIS — G4733 Obstructive sleep apnea (adult) (pediatric): Secondary | ICD-10-CM | POA: Diagnosis not present

## 2021-12-10 DIAGNOSIS — E538 Deficiency of other specified B group vitamins: Secondary | ICD-10-CM

## 2021-12-10 NOTE — Progress Notes (Signed)
Chief Complaint:   OBESITY Mary Larsen (MR# [id number]) is a 61 y.o. female who presents for evaluation and treatment of obesity and related comorbidities. Current BMI is Body mass index is 41.93 kg/m. Mary Larsen has been struggling with her weight for many years and has been unsuccessful in either losing weight, maintaining weight loss, or reaching her healthy weight goal.  Mary Larsen is a returning patient who was last seen on 01/14/2018 which has been longer than 3 years. She states that she does not like to cook.   Mary Larsen is currently in the action stage of change and ready to dedicate time achieving and maintaining a healthier weight. Mary Larsen is interested in becoming our patient and working on intensive lifestyle modifications including (but not limited to) diet and exercise for weight loss.  Mary Larsen's habits were reviewed today and are as follows: Her family eats meals together, she thinks her family will eat healthier with her, her desired weight loss is 92 pounds, she has been heavy most of her life, she started gaining weight at age 38, her heaviest weight ever was 275 pounds, she is a picky eater and doesn't like to eat healthier foods, she has significant food cravings issues, she snacks frequently in the evenings, she skips meals frequently, she is frequently drinking liquids with calories, she frequently eats larger portions than normal, and she struggles with emotional eating.  Depression Screen Mary Larsen's Food and Mood (modified PHQ-9) score was 20.     12/10/2021    9:34 AM  Depression screen PHQ 2/9  Decreased Interest 3  Down, Depressed, Hopeless 3  PHQ - 2 Score 6  Altered sleeping 3  Tired, decreased energy 3  Change in appetite 2  Feeling bad or failure about yourself  3  Trouble concentrating 2  Moving slowly or fidgety/restless 1  Suicidal thoughts 0  PHQ-9 Score 20  Difficult doing work/chores Extremely dIfficult   Subjective:   1. Other fatigue [name] denies  daytime somnolence and denies waking up still tired. Patient has a history of symptoms of daytime fatigue. [name] generally gets  6-8  hours of sleep per night, and states that she has generally restful sleep. Snoring is present. Apneic episodes is present. Epworth Sleepiness Score is 6.  [name]'s actual RMR was 1944. Her expected RMR was 1770 higher than expected.   2. SOB (shortness of breath) on exertion Mary Larsen notes increasing shortness of breath with exercising and seems to be worsening over time with weight gain. She notes getting out of breath sooner with activity than she used to. This has not gotten worse recently. [name] denies shortness of breath at rest or orthopnea.   3. Vitamin D deficiency Mary Larsen will continue taking Vitamin D. Her last Vitamin D level was 40.7.  4. Insulin resistance Mary Larsen has a history of diabetes mellitus. She is not currently on medication. Her last insulin level was 5.0.  5. Other hyperlipidemia Mary Larsen was not on medications.   6. OSA (obstructive sleep apnea) Mary Larsen is using her CPAP for 3 months.   7. Vitamin B 12 deficiency Mary Larsen is taking Vitamin B 12 currently.   8. Depression with anxiety Mary Larsen denies suicidal ideations.   Assessment/Plan:   1. Other fatigue Mary Larsen does feel that her weight is causing her energy to be lower than it should be. Fatigue may be related to obesity, depression or many other causes. Labs will be ordered, and in the meanwhile, [name] will focus on self care including making healthy  food choices, increasing physical activity and focusing on stress reduction. We discussed implications for diet and exercise and we discussed RMR in great. We reviewed EKG today.   - EKG 12-Lead  2. SOB (shortness of breath) on exertion Yocheved does feel that she gets out of breath more easily that she used to when she exercises. [name]'s shortness of breath appears to be obesity related and exercise induced. She has agreed to work on  weight loss and gradually increase exercise to treat her exercise induced shortness of breath. Will continue to monitor closely.   3. Vitamin D deficiency Low Vitamin D level contributes to fatigue and are associated with obesity, breast, and colon cancer. [name] will continue  prescription Vitamin D 50,000 IU every week and she will follow-up for routine testing of Vitamin D, at least 2-3 times per year to avoid over-replacement.  4. Insulin resistance Mary Larsen will continue to work on weight loss, exercise, and decreasing simple carbohydrates to help decrease the risk of diabetes. [name] agreed to follow-up with [place] as directed to closely monitor her progress. We will check insulin today.   - Insulin, random  5. Other hyperlipidemia Cardiovascular risk and specific lipid/LDL goals reviewed.  Mary Larsen will have no trans fats and she will decrease saturated fats. We will check lipid panel today. We discussed several lifestyle modifications today and [name] will continue to work on diet, exercise and weight loss efforts. Orders and follow up as documented in patient record.   Counseling Intensive lifestyle modifications are the first line treatment for this issue. Dietary changes: Increase soluble fiber. Decrease simple carbohydrates. Exercise changes: Moderate to vigorous-intensity aerobic activity 150 minutes per week if tolerated. Lipid-lowering medications: see documented in medical record.  - Lipid Panel With LDL/HDL Ratio  6. OSA (obstructive sleep apnea) Intensive lifestyle modifications are the first line treatment for this issue. Mary Larsen will continue to use her CPAP at night. We discussed several lifestyle modifications today and she will continue to work on diet, exercise and weight loss efforts. We will continue to monitor. Orders and follow up as documented in patient record.    7. Vitamin B 12 deficiency The diagnosis was reviewed with the patient. We will check Vitamin B 12 today.  Counseling provided today, see below. We will continue to monitor. Orders and follow up as documented in patient record.  Counseling The body needs vitamin B12: to make red blood cells; to make DNA; and to help the nerves work properly so they can carry messages from the brain to the body.  The main causes of vitamin B12 deficiency include dietary deficiency, digestive diseases, pernicious anemia, and having a surgery in which part of the stomach or small intestine is removed.  Certain medicines can make it harder for the body to absorb vitamin B12. These medicines include: heartburn medications; some antibiotics; some medications used to treat diabetes, gout, and high cholesterol.  In some cases, there are no symptoms of this condition. If the condition leads to anemia or nerve damage, various symptoms can occur, such as weakness or fatigue, shortness of breath, and numbness or tingling in your hands and feet.   Treatment:  May include taking vitamin B12 supplements.  Avoid alcohol.  Eat lots of healthy foods that contain vitamin B12: Beef, pork, chicken, [place], and organ meats, such as liver.  Seafood: This includes clams, rainbow trout, salmon, tuna, and haddock. Eggs.  Cereal and dairy products that are fortified: This means that vitamin B12 has been added to  the food.   - Vitamin B12  8. Depression with anxiety Mary Larsen will continue to see her therapist. Behavior modification techniques were discussed today to help Mary Larsen deal with her emotional/non-hunger eating behaviors.  Orders and follow up as documented in patient record.   9. Depression screening Mary Larsen had a positive depression screening. Depression is commonly associated with obesity and often results in emotional eating behaviors. We will monitor this closely and work on CBT to help improve the non-hunger eating patterns. Referral to Psychology may be required if no improvement is seen as she continues in our clinic.   10. Class  3 severe obesity with serious comorbidity and body mass index (BMI) of 40.0 to 44.9 in adult, unspecified obesity type (HCC) Mary Larsen is currently in the action stage of change and her goal is to continue with weight loss efforts. I recommend Mary Larsen begin the structured treatment plan as follows:  She has agreed to the Category 3 Plan.  Mary Larsen will continue meal planning and intentional eating. We reviewed labs from 11/13/2021 Amylase, CBC with C diff, CMP, A1C, Lipid panel, and Vitamin D.  Exercise goals: No exercise has been prescribed at this time.   Behavioral modification strategies: increasing lean protein intake, decreasing simple carbohydrates, increasing vegetables, increasing water intake, decreasing eating out, no skipping meals, meal planning and cooking strategies, keeping healthy foods in the home, and planning for success.  She was informed of the importance of frequent follow-up visits to maximize her success with intensive lifestyle modifications for her multiple health conditions. She was informed we would discuss her lab results at her next visit unless there is a critical issue that needs to be addressed sooner. Mary Larsen agreed to keep her next visit at the agreed upon time to discuss these results.  Objective:   Blood pressure 129/81, pulse 100, temperature 98 F (36.7 C), height 5\' 5"  (1.651 m), weight 252 lb (114.3 kg), SpO2 96 %. Body mass index is 41.93 kg/m.  EKG: Normal sinus rhythm, rate 89 bpm.  Indirect Calorimeter completed today shows a VO2 of 282 and a REE of 1944.  Her calculated basal metabolic rate is [id number] thus her basal metabolic rate is better than expected.  General: Cooperative, alert, well developed, in no acute distress. HEENT: Conjunctivae and lids unremarkable. Cardiovascular: Regular rhythm.  Lungs: Normal work of breathing. Neurologic: No focal deficits.   Lab Results  Component Value Date   CREATININE 0.80 06/05/2019   BUN 13 06/05/2019   NA  139 06/05/2019   K 4.0 06/05/2019   CL 105 06/05/2019   CO2 27 06/05/2019   Lab Results  Component Value Date   ALT 55 (H) 06/05/2019   AST 32 06/05/2019   ALKPHOS 76 06/05/2019   BILITOT 1.2 06/05/2019   Lab Results  Component Value Date   HGBA1C 5.1 09/16/2017   Lab Results  Component Value Date   INSULIN 9.8 01/12/2018   INSULIN 16.2 09/16/2017   Lab Results  Component Value Date   TSH 3.450 09/16/2017   Lab Results  Component Value Date   CHOL 204 (H) 01/12/2018   HDL 54 01/12/2018   LDLCALC 129 (H) 01/12/2018   TRIG 104 01/12/2018   Lab Results  Component Value Date   WBC 4.4 06/05/2019   HGB 13.0 06/05/2019   HCT 40.3 06/05/2019   MCV 89.6 06/05/2019   PLT 217 06/05/2019   No results found for: "IRON", "TIBC", "FERRITIN"  Attestation Statements:   Reviewed by clinician on day  of visit: allergies, medications, problem list, medical history, surgical history, family history, social history, and previous encounter notes.  I, Lizbeth Bark, RMA, am acting as Location manager for [employer], DO.  I have reviewed the above documentation for accuracy and completeness, and I agree with the above. Jearld Lesch, DO

## 2021-12-11 LAB — LIPID PANEL WITH LDL/HDL RATIO
Cholesterol, Total: 204 mg/dL — ABNORMAL HIGH (ref 100–199)
HDL: 59 mg/dL (ref >39)
LDL Chol Calc (NIH): 127 mg/dL — ABNORMAL HIGH (ref 0–99)
LDL/HDL Ratio: 2.2 ratio (ref 0.0–3.2)
Triglycerides: 102 mg/dL (ref 0–149)
VLDL Cholesterol Cal: 18 mg/dL (ref 5–40)

## 2021-12-11 LAB — VITAMIN B12: Vitamin B-12: 955 pg/mL (ref 232–1245)

## 2021-12-11 LAB — INSULIN, RANDOM: INSULIN: 15.1 u[IU]/mL (ref 2.6–24.9)

## 2021-12-19 ENCOUNTER — Ambulatory Visit
Admission: RE | Admit: 2021-12-19 | Discharge: 2021-12-19 | Disposition: A | Discharge status: Home / Self Care | Payer: BC Managed Care – PPO | Source: Ambulatory Visit | Attending: Family Medicine | Admitting: Family Medicine

## 2021-12-19 DIAGNOSIS — R748 Abnormal levels of other serum enzymes: Secondary | ICD-10-CM

## 2021-12-19 DIAGNOSIS — R1011 Right upper quadrant pain: Secondary | ICD-10-CM

## 2021-12-24 ENCOUNTER — Encounter (INDEPENDENT_AMBULATORY_CARE_PROVIDER_SITE_OTHER): Payer: Self-pay | Admitting: Bariatrics

## 2021-12-24 ENCOUNTER — Ambulatory Visit (INDEPENDENT_AMBULATORY_CARE_PROVIDER_SITE_OTHER): Payer: BC Managed Care – PPO | Admitting: Bariatrics

## 2021-12-24 VITALS — BP 132/91 | HR 95 | Temp 98.1°F | Ht 65.0 in | Wt 254.0 lb

## 2021-12-24 DIAGNOSIS — E7800 Pure hypercholesterolemia, unspecified: Secondary | ICD-10-CM

## 2021-12-24 DIAGNOSIS — E8881 Metabolic syndrome: Secondary | ICD-10-CM

## 2021-12-24 DIAGNOSIS — R030 Elevated blood-pressure reading, without diagnosis of hypertension: Secondary | ICD-10-CM | POA: Diagnosis not present

## 2021-12-24 DIAGNOSIS — Z6841 Body Mass Index (BMI) 40.0 and over, adult: Secondary | ICD-10-CM

## 2021-12-24 DIAGNOSIS — K760 Fatty (change of) liver, not elsewhere classified: Secondary | ICD-10-CM | POA: Diagnosis not present

## 2021-12-24 DIAGNOSIS — R632 Polyphagia: Secondary | ICD-10-CM

## 2021-12-24 DIAGNOSIS — E669 Obesity, unspecified: Secondary | ICD-10-CM

## 2021-12-24 MED ORDER — WEGOVY 0.25 MG/0.5ML ~~LOC~~ SOAJ: 0.2500 mg | SUBCUTANEOUS | 0 refills | Status: DC

## 2021-12-25 ENCOUNTER — Encounter (INDEPENDENT_AMBULATORY_CARE_PROVIDER_SITE_OTHER): Payer: Self-pay

## 2021-12-25 ENCOUNTER — Telehealth (INDEPENDENT_AMBULATORY_CARE_PROVIDER_SITE_OTHER): Payer: Self-pay | Admitting: Bariatrics

## 2021-12-25 NOTE — Telephone Encounter (Signed)
Dr. Manson Passey - Prior authorization approved for [id number]. Effective: 12/25/2021 - 07/27/2022. Patient sent approval message via mychart.

## 2021-12-29 ENCOUNTER — Encounter (INDEPENDENT_AMBULATORY_CARE_PROVIDER_SITE_OTHER): Payer: Self-pay | Admitting: Bariatrics

## 2021-12-29 NOTE — Progress Notes (Signed)
Chief Complaint:   OBESITY Mary Larsen is here to discuss her progress with her obesity treatment plan along with follow-up of her obesity related diagnoses. Mary Larsen is on the Category 3 Plan and states she is following her eating plan approximately 75% of the time. Mary Larsen states she is walking 3,000 steps 7 times per week.  Today's visit was #: 2 Starting weight: 252 lbs Starting date: 12/10/2021 Today's weight: 254 lbs Today's date: 12/24/2021 Total lbs lost to date: 0 Total lbs lost since last in-office visit: 0  Interim History: Mary Larsen is up 2 pounds since her first visit.  She states that she rebelled against the program, but she notes more stress.  Subjective:   1. Insulin resistance (mild) Mary Larsen's insulin level is 15.1.  2. Elevated cholesterol Mary Larsen is not on medications currently.  3. Fatty liver Mary Larsen's ultrasound showed extensive cholelithiasis, possible liver steatosis.   4. Elevated blood pressure reading Mary Larsen has no history of hypertension.  Her previous blood pressure readings were okay.  5. Polyphagia Mary Larsen has no contraindications to [facility].  Assessment/Plan:   1. Insulin resistance (mild) Mary Larsen will keep her carbohydrates low (starches and sweets).  Insulin resistance and prediabetes handouts were given to the patient today.  2. Elevated cholesterol Mary Larsen is to eliminate trans fats and will keep her saturated fats low, except for dairy, unprocessed meat, and dark chocolate.  3. Fatty liver Mary Larsen will work on her meal plan and exercise.  4. Elevated blood pressure reading We will continue to monitor Mary Larsen's blood pressure, and we will follow-up at her next visit.  5. Polyphagia Mary Larsen agreed to start [facility] 0.25 mg once weekly, with no refills.  We will follow-up at her next visit.  - Semaglutide-Weight Management (WEGOVY) 0.25 MG/0.5ML SOAJ; Inject 0.25 mg into the skin once a week.  Dispense: 2 mL; Refill: 0  6. Obesity, Current BMI  42.3 Mary Larsen is currently in the action stage of change. As such, her goal is to continue with weight loss efforts. She has agreed to the Category 3 Plan.   Meal planning and mindful eating were discussed.  Review labs with the patient from 12/10/2021, lipid panel, vitamin B12.  We discussed medication options and GLP-1s.  Exercise goals: As is.  Behavioral modification strategies: increasing lean protein intake, decreasing simple carbohydrates, increasing vegetables, increasing water intake, decreasing eating out, no skipping meals, meal planning and cooking strategies, keeping healthy foods in the home, and planning for success.  Mary Larsen has agreed to follow-up with our clinic in 2 to 3 weeks. She was informed of the importance of frequent follow-up visits to maximize her success with intensive lifestyle modifications for her multiple health conditions.   Objective:   Blood pressure (!) 132/91, pulse 95, temperature 98.1 F (36.7 C), height 5\' 5"  (1.651 m), weight 254 lb (115.2 kg), SpO2 97 %. Body mass index is 42.27 kg/m.  General: Cooperative, alert, well developed, in no acute distress. HEENT: Conjunctivae and lids unremarkable. Cardiovascular: Regular rhythm.  Lungs: Normal work of breathing. Neurologic: No focal deficits.   Lab Results  Component Value Date   CREATININE 0.80 06/05/2019   BUN 13 06/05/2019   NA 139 06/05/2019   K 4.0 06/05/2019   CL 105 06/05/2019   CO2 27 06/05/2019   Lab Results  Component Value Date   ALT 55 (H) 06/05/2019   AST 32 06/05/2019   ALKPHOS 76 06/05/2019   BILITOT 1.2 06/05/2019   Lab Results  Component Value Date  HGBA1C 5.1 09/16/2017   Lab Results  Component Value Date   INSULIN 15.1 12/10/2021   INSULIN 9.8 01/12/2018   INSULIN 16.2 09/16/2017   Lab Results  Component Value Date   TSH 3.450 09/16/2017   Lab Results  Component Value Date   CHOL 204 (H) 12/10/2021   HDL 59 12/10/2021   LDLCALC 127 (H) 12/10/2021   TRIG  102 12/10/2021   Lab Results  Component Value Date   VD25OH 29.3 (L) 01/12/2018   VD25OH 26.0 (L) 09/16/2017   Lab Results  Component Value Date   WBC 4.4 06/05/2019   HGB 13.0 06/05/2019   HCT 40.3 06/05/2019   MCV 89.6 06/05/2019   PLT 217 06/05/2019   No results found for: "IRON", "TIBC", "FERRITIN"  Attestation Statements:   Reviewed by clinician on day of visit: allergies, medications, problem list, medical history, surgical history, family history, social history, and previous encounter notes.   Trude Mcburney, am acting as Energy manager for [employer], DO.  I have reviewed the above documentation for accuracy and completeness, and I agree with the above. Corinna Capra, DO

## 2022-01-13 ENCOUNTER — Encounter (INDEPENDENT_AMBULATORY_CARE_PROVIDER_SITE_OTHER): Payer: Self-pay | Admitting: Bariatrics

## 2022-01-13 ENCOUNTER — Ambulatory Visit (INDEPENDENT_AMBULATORY_CARE_PROVIDER_SITE_OTHER): Payer: BC Managed Care – PPO | Admitting: Bariatrics

## 2022-01-13 ENCOUNTER — Other Ambulatory Visit (HOSPITAL_COMMUNITY): Payer: Self-pay

## 2022-01-13 VITALS — BP 110/75 | HR 94 | Temp 97.9°F | Ht 65.0 in | Wt 256.0 lb

## 2022-01-13 DIAGNOSIS — Z6841 Body Mass Index (BMI) 40.0 and over, adult: Secondary | ICD-10-CM

## 2022-01-13 DIAGNOSIS — E8881 Metabolic syndrome: Secondary | ICD-10-CM | POA: Diagnosis not present

## 2022-01-13 DIAGNOSIS — R632 Polyphagia: Secondary | ICD-10-CM | POA: Diagnosis not present

## 2022-01-13 DIAGNOSIS — E669 Obesity, unspecified: Secondary | ICD-10-CM | POA: Diagnosis not present

## 2022-01-13 DIAGNOSIS — E559 Vitamin D deficiency, unspecified: Secondary | ICD-10-CM

## 2022-01-13 MED ORDER — VITAMIN D (ERGOCALCIFEROL) 1.25 MG (50000 UNIT) PO CAPS
50000.0000 [IU] | ORAL_CAPSULE | ORAL | 0 refills | Status: DC
  Filled 2022-01-13: qty 5, 35d supply, fill #0

## 2022-01-13 MED ORDER — ONDANSETRON HCL 4 MG PO TABS
4.0000 mg | ORAL_TABLET | Freq: Three times a day (TID) | ORAL | 0 refills | Status: DC | PRN
  Filled 2022-01-13: qty 18, 21d supply, fill #0

## 2022-01-13 MED ORDER — SEMAGLUTIDE-WEIGHT MANAGEMENT 0.5 MG/0.5ML ~~LOC~~ SOAJ
0.5000 mg | SUBCUTANEOUS | 0 refills | Status: DC
  Filled 2022-01-13: qty 2, 28d supply, fill #0

## 2022-01-19 NOTE — Progress Notes (Unsigned)
Chief Complaint:   OBESITY Mary Larsen is here to discuss her progress with her obesity treatment plan along with follow-up of her obesity related diagnoses. Mary Larsen is on the Category 3 Plan and states she is following her eating plan approximately 70% of the time. Mary Larsen states she is doing 0 minutes 0 times per week.  Today's visit was #: 3 Starting weight: 252 lbs Starting date: 12/10/2021 Today's weight: 256 lbs Today's date: 01/13/2022 Total lbs lost to date: 0 Total lbs lost since last in-office visit: 0  Interim History: Mary Larsen is not eating out or eating other chocolate.  She is up about 6 pounds in water weight since her last visit.  Subjective:   1. Polyphagia Mary Larsen is taking Wegovy 0.25 mg once weekly.  2. Insulin resistance Mary Larsen was prescribed Wegovy.   3. Vitamin D deficiency Mary Larsen is currently taking Vitamin D.  Assessment/Plan:   1. Polyphagia Gabriel agreed to start Zofran 4 mg once every 8 hours as needed for nausea or vomiting, with no refills; she agreed to increase Wegovy to 0.5 mg once weekly, and we will refill for 1 month.  - Semaglutide-Weight Management 0.5 MG/0.5ML SOAJ; Inject 0.5 mg into the skin once a week.  Dispense: 2 mL; Refill: 0 - ondansetron (ZOFRAN) 4 MG tablet; Take 1 tablet (4 mg total) by mouth every 8 (eight) hours as needed for nausea or vomiting.  Dispense: 30 tablet; Refill: 0  2. Insulin resistance Mary Larsen agreed to increase [place], and we will follow-up at her next visit.  3. Vitamin D deficiency Mary Larsen will continue prescription vitamin D 50,000 units once weekly, and we will refill for 1 month.  - Vitamin D, Ergocalciferol, (DRISDOL) 1.25 MG (50000 UNIT) CAPS capsule; Take 1 capsule (50,000 Units total) by mouth every 7 (seven) days.  Dispense: 5 capsule; Refill: 0  4. Obesity, Current BMI 42.6 Mary Larsen is currently in the action stage of change. As such, her goal is to continue with weight loss efforts. She has agreed to the  Category 2 Plan.   Meal planning and intentional eating were discussed.  She will go from category 3 to category 2.  Exercise goals: We will go to the gym.  Behavioral modification strategies: increasing lean protein intake, decreasing simple carbohydrates, increasing vegetables, increasing water intake, decreasing eating out, no skipping meals, meal planning and cooking strategies, keeping healthy foods in the home, and planning for success.  Mary Larsen has agreed to follow-up with our clinic in 2 to 3 weeks. She was informed of the importance of frequent follow-up visits to maximize her success with intensive lifestyle modifications for her multiple health conditions.   Objective:   Blood pressure 110/75, pulse 94, temperature 97.9 F (36.6 C), height 5\' 5"  (1.651 m), weight 256 lb (116.1 kg), SpO2 98 %. Body mass index is 42.6 kg/m.  General: Cooperative, alert, well developed, in no acute distress. HEENT: Conjunctivae and lids unremarkable. Cardiovascular: Regular rhythm.  Lungs: Normal work of breathing. Neurologic: No focal deficits.   Lab Results  Component Value Date   CREATININE 0.80 06/05/2019   BUN 13 06/05/2019   NA 139 06/05/2019   K 4.0 06/05/2019   CL 105 06/05/2019   CO2 27 06/05/2019   Lab Results  Component Value Date   ALT 55 (H) 06/05/2019   AST 32 06/05/2019   ALKPHOS 76 06/05/2019   BILITOT 1.2 06/05/2019   Lab Results  Component Value Date   HGBA1C 5.1 09/16/2017   Lab Results  Component Value Date   INSULIN 15.1 12/10/2021   INSULIN 9.8 01/12/2018   INSULIN 16.2 09/16/2017   Lab Results  Component Value Date   TSH 3.450 09/16/2017   Lab Results  Component Value Date   CHOL 204 (H) 12/10/2021   HDL 59 12/10/2021   LDLCALC 127 (H) 12/10/2021   TRIG 102 12/10/2021   Lab Results  Component Value Date   VD25OH 29.3 (L) 01/12/2018   VD25OH 26.0 (L) 09/16/2017   Lab Results  Component Value Date   WBC 4.4 06/05/2019   HGB 13.0  06/05/2019   HCT 40.3 06/05/2019   MCV 89.6 06/05/2019   PLT 217 06/05/2019   No results found for: "IRON", "TIBC", "FERRITIN"  Attestation Statements:   Reviewed by clinician on day of visit: allergies, medications, problem list, medical history, surgical history, family history, social history, and previous encounter notes.   Trude Mcburney, am acting as Energy manager for [employer], DO.  I have reviewed the above documentation for accuracy and completeness, and I agree with the above. Corinna Capra, DO

## 2022-01-20 ENCOUNTER — Encounter (INDEPENDENT_AMBULATORY_CARE_PROVIDER_SITE_OTHER): Payer: Self-pay | Admitting: Bariatrics

## 2022-02-02 ENCOUNTER — Other Ambulatory Visit: Payer: Self-pay | Admitting: Family Medicine

## 2022-02-02 DIAGNOSIS — Z1231 Encounter for screening mammogram for malignant neoplasm of breast: Secondary | ICD-10-CM

## 2022-02-03 ENCOUNTER — Ambulatory Visit (INDEPENDENT_AMBULATORY_CARE_PROVIDER_SITE_OTHER): Payer: BC Managed Care – PPO | Admitting: Family Medicine

## 2022-02-03 ENCOUNTER — Encounter (INDEPENDENT_AMBULATORY_CARE_PROVIDER_SITE_OTHER): Payer: Self-pay | Admitting: Family Medicine

## 2022-02-03 ENCOUNTER — Other Ambulatory Visit (HOSPITAL_COMMUNITY): Payer: Self-pay

## 2022-02-03 VITALS — BP 125/83 | HR 99 | Temp 98.4°F | Ht 65.0 in | Wt 246.0 lb

## 2022-02-03 DIAGNOSIS — Z6841 Body Mass Index (BMI) 40.0 and over, adult: Secondary | ICD-10-CM | POA: Diagnosis not present

## 2022-02-03 DIAGNOSIS — E669 Obesity, unspecified: Secondary | ICD-10-CM

## 2022-02-03 DIAGNOSIS — E559 Vitamin D deficiency, unspecified: Secondary | ICD-10-CM | POA: Diagnosis not present

## 2022-02-03 DIAGNOSIS — R110 Nausea: Secondary | ICD-10-CM

## 2022-02-03 MED ORDER — SEMAGLUTIDE-WEIGHT MANAGEMENT 0.5 MG/0.5ML ~~LOC~~ SOAJ
0.5000 mg | SUBCUTANEOUS | 0 refills | Status: DC
  Filled 2022-02-03: qty 2, 28d supply, fill #0

## 2022-02-03 MED ORDER — ONDANSETRON HCL 4 MG PO TABS
4.0000 mg | ORAL_TABLET | Freq: Three times a day (TID) | ORAL | 0 refills | Status: DC | PRN
  Filled 2022-02-06 – 2022-02-25 (×2): qty 18, 21d supply, fill #0

## 2022-02-03 MED ORDER — VITAMIN D (ERGOCALCIFEROL) 1.25 MG (50000 UNIT) PO CAPS
50000.0000 [IU] | ORAL_CAPSULE | ORAL | 0 refills | Status: DC
  Filled 2022-02-06 – 2022-02-25 (×2): qty 5, 35d supply, fill #0

## 2022-02-04 ENCOUNTER — Other Ambulatory Visit (HOSPITAL_COMMUNITY): Payer: Self-pay

## 2022-02-04 ENCOUNTER — Encounter (INDEPENDENT_AMBULATORY_CARE_PROVIDER_SITE_OTHER): Payer: Self-pay

## 2022-02-05 ENCOUNTER — Other Ambulatory Visit (HOSPITAL_COMMUNITY): Payer: Self-pay

## 2022-02-06 ENCOUNTER — Other Ambulatory Visit (HOSPITAL_COMMUNITY): Payer: Self-pay

## 2022-02-09 ENCOUNTER — Other Ambulatory Visit (HOSPITAL_COMMUNITY): Payer: Self-pay

## 2022-02-10 NOTE — Progress Notes (Signed)
Chief Complaint:   OBESITY Mary Larsen is here to discuss her progress with her obesity treatment plan along with follow-up of her obesity related diagnoses. Mary Larsen is on the Category 2 Plan and states she is following her eating plan approximately 95% of the time. Mary Larsen states she is walking for 20 minutes 2 times per week.  Today's visit was #: 4 Starting weight: 252 lbs Starting date: 12/10/2021 Today's weight: 246 lbs Today's date: 02/03/2022 Total lbs lost to date: 6 Total lbs lost since last in-office visit: 10  Interim History: Mary Larsen is doing well with weight loss. She is on Wegovy, but had to start at 0.5 mg due to medication shortages. She notes some nausea and she is taking Zofran.   Subjective:   1. Vitamin D deficiency Mary Larsen's recent Vitamin D level was 40.7.  2. Nausea Mary Larsen is currently taking Zofran for nausea.   Assessment/Plan:   1. Vitamin D deficiency We will refill prescription Vitamin D for 1 month. Mary Larsen will follow-up for routine testing of Vitamin D, at least 2-3 times per year to avoid over-replacement.  - Vitamin D, Ergocalciferol, (DRISDOL) 1.25 MG (50000 UNIT) CAPS capsule; Take 1 capsule (50,000 Units total) by mouth every 7 (seven) days.  Dispense: 5 capsule; Refill: 0  2. Nausea We will refill Zofran for 1 month. We will not increase GLP-1 until Mary Larsen is off Zofran.   - ondansetron (ZOFRAN) 4 MG tablet; Take 1 tablet (4 mg total) by mouth every 8 (eight) hours as needed for nausea or vomiting.  Dispense: 30 tablet; Refill: 0  3. Obesity, Current BMI 41.1 Mckaylie is currently in the action stage of change. As such, her goal is to continue with weight loss efforts. She has agreed to the Category 2 Plan.   We discussed various medication options to help Mary Larsen with her weight loss efforts and we both agreed to continue Wegovy 0.5 mg once weekly, and we will refill for 1 month. Mary Larsen was encouraged to stop Zofran as soon as possible as it  counteracts.   - Semaglutide-Weight Management 0.5 MG/0.5ML SOAJ; Inject 0.5 mg into the skin once a week.  Dispense: 2 mL; Refill: 0  Exercise goals: As is.   Behavioral modification strategies: increasing lean protein intake and decreasing simple carbohydrates.  Mary Larsen has agreed to follow-up with our clinic in 2 to 3 weeks. She was informed of the importance of frequent follow-up visits to maximize her success with intensive lifestyle modifications for her multiple health conditions.   Objective:   Blood pressure 125/83, pulse 99, temperature 98.4 F (36.9 C), height 5\' 5"  (1.651 m), weight 246 lb (111.6 kg), SpO2 96 %. Body mass index is 40.94 kg/m.  General: Cooperative, alert, well developed, in no acute distress. HEENT: Conjunctivae and lids unremarkable. Cardiovascular: Regular rhythm.  Lungs: Normal work of breathing. Neurologic: No focal deficits.   Lab Results  Component Value Date   CREATININE 0.80 06/05/2019   BUN 13 06/05/2019   NA 139 06/05/2019   K 4.0 06/05/2019   CL 105 06/05/2019   CO2 27 06/05/2019   Lab Results  Component Value Date   ALT 55 (H) 06/05/2019   AST 32 06/05/2019   ALKPHOS 76 06/05/2019   BILITOT 1.2 06/05/2019   Lab Results  Component Value Date   HGBA1C 5.1 09/16/2017   Lab Results  Component Value Date   INSULIN 15.1 12/10/2021   INSULIN 9.8 01/12/2018   INSULIN 16.2 09/16/2017   Lab Results  Component Value Date   TSH 3.450 09/16/2017   Lab Results  Component Value Date   CHOL 204 (H) 12/10/2021   HDL 59 12/10/2021   LDLCALC 127 (H) 12/10/2021   TRIG 102 12/10/2021   Lab Results  Component Value Date   VD25OH 29.3 (L) 01/12/2018   VD25OH 26.0 (L) 09/16/2017   Lab Results  Component Value Date   WBC 4.4 06/05/2019   HGB 13.0 06/05/2019   HCT 40.3 06/05/2019   MCV 89.6 06/05/2019   PLT 217 06/05/2019   No results found for: "IRON", "TIBC", "FERRITIN"  Attestation Statements:   Reviewed by clinician on day  of visit: allergies, medications, problem list, medical history, surgical history, family history, social history, and previous encounter notes.   I, Burt Knack, am acting as transcriptionist for Quillian Quince, MD.  I have reviewed the above documentation for accuracy and completeness, and I agree with the above. -  Quillian Quince, MD

## 2022-02-12 ENCOUNTER — Encounter (INDEPENDENT_AMBULATORY_CARE_PROVIDER_SITE_OTHER): Payer: Self-pay | Admitting: Bariatrics

## 2022-02-12 NOTE — Telephone Encounter (Signed)
Notified patient per Dr. Dalbert Garnet to stop taking Wegovy until she can get into the office to discuss medication with a provider. Notified patient of warnings of dehydration and to call or go to ER in case of emergency. Patient verbalized understanding.

## 2022-02-16 ENCOUNTER — Other Ambulatory Visit (INDEPENDENT_AMBULATORY_CARE_PROVIDER_SITE_OTHER): Payer: Self-pay | Admitting: Family Medicine

## 2022-02-16 NOTE — Telephone Encounter (Signed)
Message from patient, please advise:   Patient comment: My current dose is too much. Please find 0.5

## 2022-02-17 ENCOUNTER — Other Ambulatory Visit (HOSPITAL_COMMUNITY): Payer: Self-pay

## 2022-02-17 NOTE — Telephone Encounter (Signed)
Pt to stop the medicine completely and discuss other options at her next visit.

## 2022-02-19 ENCOUNTER — Ambulatory Visit: Payer: BC Managed Care – PPO

## 2022-02-24 ENCOUNTER — Ambulatory Visit (INDEPENDENT_AMBULATORY_CARE_PROVIDER_SITE_OTHER): Payer: BC Managed Care – PPO | Admitting: Family Medicine

## 2022-02-24 ENCOUNTER — Encounter (INDEPENDENT_AMBULATORY_CARE_PROVIDER_SITE_OTHER): Payer: Self-pay | Admitting: Family Medicine

## 2022-02-24 ENCOUNTER — Other Ambulatory Visit (HOSPITAL_COMMUNITY): Payer: Self-pay

## 2022-02-24 VITALS — BP 120/86 | HR 98 | Temp 98.2°F | Ht 65.0 in | Wt 241.0 lb

## 2022-02-24 DIAGNOSIS — E669 Obesity, unspecified: Secondary | ICD-10-CM

## 2022-02-24 DIAGNOSIS — Z6841 Body Mass Index (BMI) 40.0 and over, adult: Secondary | ICD-10-CM | POA: Diagnosis not present

## 2022-02-24 DIAGNOSIS — E559 Vitamin D deficiency, unspecified: Secondary | ICD-10-CM

## 2022-02-24 MED ORDER — WEGOVY 0.25 MG/0.5ML ~~LOC~~ SOAJ
0.2500 mg | SUBCUTANEOUS | 0 refills | Status: DC
  Filled 2022-02-24 – 2022-02-25 (×3): qty 2, 28d supply, fill #0

## 2022-02-25 ENCOUNTER — Other Ambulatory Visit (HOSPITAL_BASED_OUTPATIENT_CLINIC_OR_DEPARTMENT_OTHER): Payer: Self-pay

## 2022-03-03 NOTE — Progress Notes (Signed)
Chief Complaint:   OBESITY Mary Larsen is here to discuss her progress with her obesity treatment plan along with follow-up of her obesity related diagnoses. Mary Larsen is on the Category 2 Plan and states she is following her eating plan approximately 98% of the time. Mary Larsen states she is walking 1,000 steps daily.   Today's visit was #: 5 Starting weight: 252 lbs Starting date: 12/10/2020 Today's weight: 241 lbs Today's date: 02/24/2022 Total lbs lost to date: 11 Total lbs lost since last in-office visit: 5  Interim History: Mary Larsen continues to do well with weight loss. She was started on Wegovy at 0.5 mg and she had significant vomiting. She would like to restart at a lower dose.   Subjective:   1. Vitamin D deficiency Mary Larsen is on Vitamin D, and her last lab was low. She notes fatigue.   Assessment/Plan:   1. Vitamin D deficiency Mary Larsen will continue prescription Vitamin D 50,000 IU every week, and we will recheck labs in 1 month. She will follow-up for routine testing of Vitamin D, at least 2-3 times per year to avoid over-replacement.  2. Obesity, current BMI 40.2 [name] is currently in the action stage of change. As such, her goal is to continue with weight loss efforts. She has agreed to the Category 2 Plan.   We discussed various medication options to help Mary Larsen with her weight loss efforts and we both agreed to start Wegovy 0.25 mg once weekly with no refills (may try Saxenda if she cannot find this dose).  - Semaglutide-Weight Management (WEGOVY) 0.25 MG/0.5ML SOAJ; Inject 0.25 mg into the skin once a week.  Dispense: 2 mL; Refill: 0  Exercise goals: As is.   Behavioral modification strategies: increasing lean protein intake and no skipping meals.  Mary Larsen has agreed to follow-up with our clinic in 3 to 4 weeks. She was informed of the importance of frequent follow-up visits to maximize her success with intensive lifestyle modifications for her multiple health conditions.    Objective:   Blood pressure 120/86, pulse 98, temperature 98.2 F (36.8 C), height 5\' 5"  (1.651 m), weight 241 lb (109.3 kg), SpO2 96 %. Body mass index is 40.1 kg/m.  General: Cooperative, alert, well developed, in no acute distress. HEENT: Conjunctivae and lids unremarkable. Cardiovascular: Regular rhythm.  Lungs: Normal work of breathing. Neurologic: No focal deficits.   Lab Results  Component Value Date   CREATININE 0.80 06/05/2019   BUN 13 06/05/2019   NA 139 06/05/2019   K 4.0 06/05/2019   CL 105 06/05/2019   CO2 27 06/05/2019   Lab Results  Component Value Date   ALT 55 (H) 06/05/2019   AST 32 06/05/2019   ALKPHOS 76 06/05/2019   BILITOT 1.2 06/05/2019   Lab Results  Component Value Date   HGBA1C 5.1 09/16/2017   Lab Results  Component Value Date   INSULIN 15.1 12/10/2021   INSULIN 9.8 01/12/2018   INSULIN 16.2 09/16/2017   Lab Results  Component Value Date   TSH 3.450 09/16/2017   Lab Results  Component Value Date   CHOL 204 (H) 12/10/2021   HDL 59 12/10/2021   LDLCALC 127 (H) 12/10/2021   TRIG 102 12/10/2021   Lab Results  Component Value Date   VD25OH 29.3 (L) 01/12/2018   VD25OH 26.0 (L) 09/16/2017   Lab Results  Component Value Date   WBC 4.4 06/05/2019   HGB 13.0 06/05/2019   HCT 40.3 06/05/2019   MCV 89.6 06/05/2019  PLT 217 06/05/2019   No results found for: "IRON", "TIBC", "FERRITIN"  Attestation Statements:   Reviewed by clinician on day of visit: allergies, medications, problem list, medical history, surgical history, family history, social history, and previous encounter notes.  Time spent on visit including pre-visit chart review and post-visit care and charting was 30 minutes.   I, Burt Knack, am acting as transcriptionist for Quillian Quince, MD.  I have reviewed the above documentation for accuracy and completeness, and I agree with the above. -  Quillian Quince, MD

## 2022-03-11 ENCOUNTER — Other Ambulatory Visit (INDEPENDENT_AMBULATORY_CARE_PROVIDER_SITE_OTHER): Payer: Self-pay | Admitting: Family Medicine

## 2022-03-11 ENCOUNTER — Ambulatory Visit: Payer: BC Managed Care – PPO

## 2022-03-12 ENCOUNTER — Other Ambulatory Visit (HOSPITAL_BASED_OUTPATIENT_CLINIC_OR_DEPARTMENT_OTHER): Payer: Self-pay

## 2022-03-17 ENCOUNTER — Ambulatory Visit (INDEPENDENT_AMBULATORY_CARE_PROVIDER_SITE_OTHER): Payer: BC Managed Care – PPO | Admitting: Family Medicine

## 2022-03-17 ENCOUNTER — Encounter (INDEPENDENT_AMBULATORY_CARE_PROVIDER_SITE_OTHER): Payer: Self-pay | Admitting: Family Medicine

## 2022-03-17 VITALS — BP 124/85 | HR 100 | Temp 97.9°F | Ht 65.0 in | Wt 235.0 lb

## 2022-03-17 DIAGNOSIS — F418 Other specified anxiety disorders: Secondary | ICD-10-CM | POA: Diagnosis not present

## 2022-03-17 DIAGNOSIS — E669 Obesity, unspecified: Secondary | ICD-10-CM | POA: Diagnosis not present

## 2022-03-17 DIAGNOSIS — E66813 Obesity, class 3: Secondary | ICD-10-CM

## 2022-03-17 DIAGNOSIS — Z6839 Body mass index (BMI) 39.0-39.9, adult: Secondary | ICD-10-CM

## 2022-03-24 NOTE — Progress Notes (Signed)
Chief Complaint:   OBESITY Mary Larsen is here to discuss her progress with her obesity treatment plan along with follow-up of her obesity related diagnoses. Mary Larsen is on the Category 2 Plan and states she is following her eating plan approximately 95% of the time. Mary Larsen states she is doing 0 minutes 0 times per week.  Today's visit was #: 6 Starting weight: 252 lbs Starting date: 12/10/2020 Today's weight: 235 lbs Today's date: 03/17/2022 Total lbs lost to date: 17 Total lbs lost since last in-office visit: 6  Interim History: Mary Larsen has had horrible nausea and vomiting even with the lowest dose of Wegovy. She I also noting headache, but she has mostly been able to hydrate and keep her water down.   Subjective:   1. Depression with anxiety Mary Larsen hasn't been doing much emotional eating behaviors, mostly due to severe nausea. She is on Wellbutrin, Effexor, and Xanax as needed.   Assessment/Plan:   1. Depression with anxiety Mary Larsen is to continue her medications and we will reassess her emotional eating behaviors after she is able to eat normal again.  2. Obesity, Current BMI 39.2 Mary Larsen is currently in the action stage of change. As such, her goal is to continue with weight loss efforts. She has agreed to practicing portion control and making smarter food choices, such as increasing vegetables and decreasing simple carbohydrates.   Mary Larsen agreed to discontinue NOBSJG and take Zofran as needed, and we will reassess her in 2 week but she is to try to eat as tolerated until nausea decreases.   Behavioral modification strategies: increasing lean protein intake.  Mary Larsen has agreed to follow-up with our clinic in 2 weeks via virtual visit. She was informed of the importance of frequent follow-up visits to maximize her success with intensive lifestyle modifications for her multiple health conditions.   Objective:   Blood pressure 124/85, pulse 100, temperature 97.9 F (36.6 C), height  5\' 5"  (1.651 m), weight 235 lb (106.6 kg), SpO2 98 %. Body mass index is 39.11 kg/m.  General: Cooperative, alert, well developed, in no acute distress. HEENT: Conjunctivae and lids unremarkable. Cardiovascular: Regular rhythm.  Lungs: Normal work of breathing. Neurologic: No focal deficits.   Lab Results  Component Value Date   CREATININE 0.80 06/05/2019   BUN 13 06/05/2019   NA 139 06/05/2019   K 4.0 06/05/2019   CL 105 06/05/2019   CO2 27 06/05/2019   Lab Results  Component Value Date   ALT 55 (H) 06/05/2019   AST 32 06/05/2019   ALKPHOS 76 06/05/2019   BILITOT 1.2 06/05/2019   Lab Results  Component Value Date   HGBA1C 5.1 09/16/2017   Lab Results  Component Value Date   INSULIN 15.1 12/10/2021   INSULIN 9.8 01/12/2018   INSULIN 16.2 09/16/2017   Lab Results  Component Value Date   TSH 3.450 09/16/2017   Lab Results  Component Value Date   CHOL 204 (H) 12/10/2021   HDL 59 12/10/2021   LDLCALC 127 (H) 12/10/2021   TRIG 102 12/10/2021   Lab Results  Component Value Date   VD25OH 29.3 (L) 01/12/2018   VD25OH 26.0 (L) 09/16/2017   Lab Results  Component Value Date   WBC 4.4 06/05/2019   HGB 13.0 06/05/2019   HCT 40.3 06/05/2019   MCV 89.6 06/05/2019   PLT 217 06/05/2019   No results found for: "IRON", "TIBC", "FERRITIN"  Attestation Statements:   Reviewed by clinician on day of visit: allergies, medications,  problem list, medical history, surgical history, family history, social history, and previous encounter notes.  Time spent on visit including pre-visit chart review and post-visit care and charting was 42 minutes.   I, Trixie Dredge, am acting as transcriptionist for Dennard Nip, MD.  I have reviewed the above documentation for accuracy and completeness, and I agree with the above. -  Dennard Nip, MD

## 2022-03-30 NOTE — Progress Notes (Signed)
TeleHealth Visit:  This visit was completed with telemedicine (audio/video) technology. Mary Larsen has verbally consented to this TeleHealth visit. The patient is located at home, the provider is located at home. The participants in this visit include the listed provider and patient. The visit was conducted today via MyChart video.  OBESITY Mary Larsen is here to discuss her progress with her obesity treatment plan along with follow-up of her obesity related diagnoses.   Today's visit was # 7 Starting weight: 252 lbs Starting date: 12/10/2020 Weight at last in office visit: 235 lbs on 03/17/22 Total weight loss: 17 lbs at last in office visit on 03/17/22. Today's reported weight: 240 lbs   Nutrition Plan: cat 2- 90% adherence.   Current exercise: 2000-3000 steps day  Interim History: Mary Larsen is now back to following category 2 plan.  She was unable to adhere to plan for a while due to severe nausea and vomiting from [facility]. She reports over the past few weeks her daughter came to visit and Mary Larsen was off plan.  She has sweets cravings. She is very disappointed she was unable to tolerate Wegovy.  She feels it took away the "food noise".  Mary Larsen denies personal or family history of thyroid cancer, history of pancreatitis. She currently has cholelithiasis and is aware that Saxenda may exacerbate this.  Mary Larsen was informed of the most common side effects (nausea, constipation, diarrhea).  Assessment/Plan:  1. Vitamin D Deficiency Vitamin D is not at goal of 50.  Last vitamin D level that is available is from 2019 and it was 29.3. She is on weekly prescription Vitamin D 50,000 IU.  Lab Results  Component Value Date   VD25OH 29.3 (L) 01/12/2018   VD25OH 26.0 (L) 09/16/2017    Plan: Refill prescription vitamin D 50,000 IU weekly. Check vitamin D level within the next 2 months.  2. Nausea Had severe nausea and vomiting with OXBDZH.  We will be starting Saxenda today so she requests a  refill of Zofran.  Plan: Refill Zofran 4 mg p.o. every 8 hours as needed for nausea.  3. Obesity: Current BMI 39.2  New prescription-Saxenda 3 mg daily, dispense 15 mL, no refills. Patient to take 5 clicks (0.3 mg daily) and stay at this dose till next visit. If she becomes nauseated at 0.3 mg, may take 2-3 clicks rather than 5 clicks. This was explained to Mary Larsen and she voiced understanding.  Mary Larsen is currently in the action stage of change. As such, her goal is to continue with weight loss efforts.  She has agreed to the Category 3 Plan.   Exercise goals: Strive to get 3000 steps daily.  Behavioral modification strategies: increasing lean protein intake, decreasing simple carbohydrates, and planning for success.  Haven has agreed to follow-up with our clinic in 2 weeks.   No orders of the defined types were placed in this encounter.   Medications Discontinued During This Encounter  Medication Reason   ondansetron (ZOFRAN) 4 MG tablet Reorder   Vitamin D, Ergocalciferol, (DRISDOL) 1.25 MG (50000 UNIT) CAPS capsule Reorder     Meds ordered this encounter  Medications   Liraglutide -Weight Management (SAXENDA) 18 MG/3ML SOPN    Sig: Inject 3 mg into the skin daily. Pt to do 5 clicks (0.3 mg daily).    Dispense:  15 mL    Refill:  0    Order Specific Question:   Supervising Provider    Answer:   Mary Larsen   Insulin Pen Needle (BD  PEN NEEDLE NANO 2ND GEN) 32G X 4 MM MISC    Sig: Use 1 needle daily to inject medication.    Dispense:  100 each    Refill:  0    Order Specific Question:   Supervising Provider    Answer:   Loyal Gambler E [[id number]]   ondansetron (ZOFRAN) 4 MG tablet    Sig: Take 1 tablet (4 mg total) by mouth every 8 (eight) hours as needed for nausea or vomiting.    Dispense:  30 tablet    Refill:  0    Order Specific Question:   Supervising Provider    Answer:   Loyal Gambler E [[id number]]   Vitamin D, Ergocalciferol, (DRISDOL) 1.25 MG (50000 UNIT)  CAPS capsule    Sig: Take 1 capsule (50,000 Units total) by mouth every 7 (seven) days.    Dispense:  5 capsule    Refill:  0    Order Specific Question:   Supervising Provider    Answer:   Dell Ponto [[id number]]      Objective:   VITALS: Per patient if applicable, see vitals. GENERAL: Alert and in no acute distress. CARDIOPULMONARY: No increased WOB. Speaking in clear sentences.  PSYCH: Pleasant and cooperative. Speech normal rate and rhythm. Affect is appropriate. Insight and judgement are appropriate. Attention is focused, linear, and appropriate.  NEURO: Oriented as arrived to appointment on time with no prompting.   Lab Results  Component Value Date   CREATININE 0.80 06/05/2019   BUN 13 06/05/2019   NA 139 06/05/2019   K 4.0 06/05/2019   CL 105 06/05/2019   CO2 27 06/05/2019   Lab Results  Component Value Date   ALT 55 (H) 06/05/2019   AST 32 06/05/2019   ALKPHOS 76 06/05/2019   BILITOT 1.2 06/05/2019   Lab Results  Component Value Date   HGBA1C 5.1 09/16/2017   Lab Results  Component Value Date   INSULIN 15.1 12/10/2021   INSULIN 9.8 01/12/2018   INSULIN 16.2 09/16/2017   Lab Results  Component Value Date   TSH 3.450 09/16/2017   Lab Results  Component Value Date   CHOL 204 (H) 12/10/2021   HDL 59 12/10/2021   LDLCALC 127 (H) 12/10/2021   TRIG 102 12/10/2021   Lab Results  Component Value Date   WBC 4.4 06/05/2019   HGB 13.0 06/05/2019   HCT 40.3 06/05/2019   MCV 89.6 06/05/2019   PLT 217 06/05/2019   No results found for: "IRON", "TIBC", "FERRITIN" Lab Results  Component Value Date   VD25OH 29.3 (L) 01/12/2018   VD25OH 26.0 (L) 09/16/2017    Attestation Statements:   Reviewed by clinician on day of visit: allergies, medications, problem list, medical history, surgical history, family history, social history, and previous encounter notes.

## 2022-03-31 ENCOUNTER — Telehealth (INDEPENDENT_AMBULATORY_CARE_PROVIDER_SITE_OTHER): Payer: BC Managed Care – PPO | Admitting: Family Medicine

## 2022-03-31 ENCOUNTER — Encounter (INDEPENDENT_AMBULATORY_CARE_PROVIDER_SITE_OTHER): Payer: Self-pay | Admitting: Family Medicine

## 2022-03-31 ENCOUNTER — Other Ambulatory Visit (HOSPITAL_COMMUNITY): Payer: Self-pay

## 2022-03-31 DIAGNOSIS — E669 Obesity, unspecified: Secondary | ICD-10-CM | POA: Diagnosis not present

## 2022-03-31 DIAGNOSIS — Z6839 Body mass index (BMI) 39.0-39.9, adult: Secondary | ICD-10-CM | POA: Diagnosis not present

## 2022-03-31 DIAGNOSIS — E559 Vitamin D deficiency, unspecified: Secondary | ICD-10-CM

## 2022-03-31 DIAGNOSIS — Z6841 Body Mass Index (BMI) 40.0 and over, adult: Secondary | ICD-10-CM

## 2022-03-31 DIAGNOSIS — R110 Nausea: Secondary | ICD-10-CM | POA: Diagnosis not present

## 2022-03-31 MED ORDER — BD PEN NEEDLE NANO 2ND GEN 32G X 4 MM MISC
0 refills | Status: DC
  Filled 2022-03-31: qty 100, 100d supply, fill #0
  Filled 2022-04-01: qty 100, 90d supply, fill #0

## 2022-03-31 MED ORDER — ONDANSETRON HCL 4 MG PO TABS
4.0000 mg | ORAL_TABLET | Freq: Three times a day (TID) | ORAL | 0 refills | Status: AC | PRN
  Filled 2022-03-31: qty 18, 21d supply, fill #0

## 2022-03-31 MED ORDER — SAXENDA 18 MG/3ML ~~LOC~~ SOPN
3.0000 mg | PEN_INJECTOR | Freq: Every day | SUBCUTANEOUS | 0 refills | Status: DC
  Filled 2022-03-31 – 2022-04-01 (×4): qty 15, 30d supply, fill #0

## 2022-03-31 MED ORDER — VITAMIN D (ERGOCALCIFEROL) 1.25 MG (50000 UNIT) PO CAPS
50000.0000 [IU] | ORAL_CAPSULE | ORAL | 0 refills | Status: DC
  Filled 2022-03-31: qty 5, 35d supply, fill #0

## 2022-04-01 ENCOUNTER — Other Ambulatory Visit (HOSPITAL_COMMUNITY): Payer: Self-pay

## 2022-04-02 ENCOUNTER — Encounter (INDEPENDENT_AMBULATORY_CARE_PROVIDER_SITE_OTHER): Payer: Self-pay

## 2022-04-02 ENCOUNTER — Other Ambulatory Visit (HOSPITAL_COMMUNITY): Payer: Self-pay

## 2022-04-02 ENCOUNTER — Telehealth (INDEPENDENT_AMBULATORY_CARE_PROVIDER_SITE_OTHER): Payer: Self-pay | Admitting: Family Medicine

## 2022-04-02 DIAGNOSIS — E88819 Insulin resistance, unspecified: Secondary | ICD-10-CM

## 2022-04-02 MED ORDER — METFORMIN HCL 500 MG PO TABS
500.0000 mg | ORAL_TABLET | Freq: Every day | ORAL | 0 refills | Status: AC
  Filled 2022-04-02: qty 30, 30d supply, fill #0

## 2022-04-02 NOTE — Telephone Encounter (Signed)
Please advise 

## 2022-04-02 NOTE — Telephone Encounter (Signed)
Spoke with patient on the phone.  She is very fearful about trying a GLP 1 again.  She had severe nausea/vomiting, constipation and headache with NXGZFP.  She has not picked up the Nunda and I told her they will leave this on file at the drugstore.  She is open to starting metformin.

## 2022-04-02 NOTE — Telephone Encounter (Signed)
Patient would like a call back from [facility] regarding a medication she had been prescribed (Saxenda). Patient states that this type of medication causes her constipation and projectile vomiting. Pt would like a call back today at the number provided.       [facility].

## 2022-04-02 NOTE — Telephone Encounter (Signed)
Dawn [employer] - Prior authorization approved for [employer]. Effective: 03/31/2022 - 08/01/2022. Patient sent approval message via mychart.

## 2022-04-10 ENCOUNTER — Other Ambulatory Visit (HOSPITAL_COMMUNITY): Payer: Self-pay

## 2022-04-13 NOTE — Progress Notes (Unsigned)
TeleHealth Visit:  This visit was completed with telemedicine (audio/video) technology. Mary Larsen has verbally consented to this TeleHealth visit. The patient is located at home, the provider is located at home. The participants in this visit include the listed provider and patient. The visit was conducted today via MyChart video.  OBESITY Mary Larsen is here to discuss her progress with her obesity treatment plan along with follow-up of her obesity related diagnoses.   Today's visit was # 8 Starting weight: 252 lbs Starting date: 12/10/2020 Weight at last in office visit: 235 lbs on 03/17/22 Total weight loss: 17 lbs at last in office visit on 03/17/22. Today's reported weight: 240 lbs   Nutrition Plan: the Category 2 Plan - 90-95% adherence Category 3 plan prescribed last visit but patient prefers to eat the amount of food on the category 2 plan.  Current exercise: 3000 steps/day. Some extra exercise - resistance.  Interim History: Mary Larsen decided not to start Saxenda prescribed at last visit because of her nausea and vomiting with ZOXWRU.  She is wondering if there is any other medication she could take. She is very upset about her slow progress with weight loss.  She feels she has put a lot of time and effort into this over the past 16 months and has not lost the amount of weight she expected.  However she did lose 15 pounds before starting our program for a total of 32 pounds of weight loss. She denies excessive hunger or cravings but notes that while on the [facility] she had no "food noise". She reports making healthy food choices and plan adherence is excellent.  She would like to have less frequent visits.  She requests 6-week follow-ups.  Assessment/Plan:  1.  Depression with anxiety Currently taking Wellbutrin XL 300 mg daily and Effexor 150 mg daily.  She reports struggling with depression all of her life.  She was tearful a few times during our visit today.  Discussed topiramate  briefly since she is interested in medication for "food noise" but she has history of nephrolithiasis. She has a visit with her psychiatrist tomorrow and plans on discussing restarting a low-dose of Adderall.  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  Plan: Reassurance provided.  I told her she is doing an excellent job with our program. Continue Wellbutrin and Effexor as ordered. Follow-up with psychiatry as directed. Continue to monitor.  2. Obstructive Sleep Apnea Mary Larsen has a diagnosis of sleep apnea.  She reports poor sleep recently because she has been off her CPAP.  She did not use this while on Wegovy due to nausea and vomiting.  She has just restarted using the CPAP.  Plan: Continue CPAP therapy.  3. Obesity: Current BMI 39.2 Mary Larsen is currently in the action stage of change. As such, her goal is to continue with weight loss efforts.  She has agreed to keeping a food journal and adhering to recommended goals of 1100-1300 calories and 85 gms protein.   I am keeping the calories on the lower end to induce faster weight loss. Patient is quite distressed by very slow weight loss.  She will begin journaling using the Lose It app.  Exercise goals: Continue to increase steps per day and resistance training.  Behavioral modification strategies: increasing lean protein intake, decreasing simple carbohydrates, planning for success, and keeping a strict food journal.  Mary Larsen has agreed to follow-up with our clinic in 6 weeks per patient request for less frequent follow-ups.  No orders of the defined types were placed in this encounter.   There are no discontinued medications.   No orders of the defined types were placed in this encounter.     Objective:   VITALS: Per patient if applicable, see vitals. GENERAL: Alert and in no acute distress. CARDIOPULMONARY: No increased WOB. Speaking in clear sentences.  PSYCH: Pleasant and cooperative.  Speech normal rate and rhythm. Affect is appropriate. Insight and judgement are appropriate. Attention is focused, linear, and appropriate.  NEURO: Oriented as arrived to appointment on time with no prompting.   Lab Results  Component Value Date   CREATININE 0.80 06/05/2019   BUN 13 06/05/2019   NA 139 06/05/2019   K 4.0 06/05/2019   CL 105 06/05/2019   CO2 27 06/05/2019   Lab Results  Component Value Date   ALT 55 (H) 06/05/2019   AST 32 06/05/2019   ALKPHOS 76 06/05/2019   BILITOT 1.2 06/05/2019   Lab Results  Component Value Date   HGBA1C 5.1 09/16/2017   Lab Results  Component Value Date   INSULIN 15.1 12/10/2021   INSULIN 9.8 01/12/2018   INSULIN 16.2 09/16/2017   Lab Results  Component Value Date   TSH 3.450 09/16/2017   Lab Results  Component Value Date   CHOL 204 (H) 12/10/2021   HDL 59 12/10/2021   LDLCALC 127 (H) 12/10/2021   TRIG 102 12/10/2021   Lab Results  Component Value Date   WBC 4.4 06/05/2019   HGB 13.0 06/05/2019   HCT 40.3 06/05/2019   MCV 89.6 06/05/2019   PLT 217 06/05/2019   No results found for: "IRON", "TIBC", "FERRITIN" Lab Results  Component Value Date   VD25OH 29.3 (L) 01/12/2018   VD25OH 26.0 (L) 09/16/2017    Attestation Statements:   Reviewed by clinician on day of visit: allergies, medications, problem list, medical history, surgical history, family history, social history, and previous encounter notes.  Time spent on visit including the items listed below was 40 minutes.  -preparing to see the patient (e.g., review of tests, history, previous notes) -obtaining and/or reviewing separately obtained history -counseling and educating the patient/family/caregiver -documenting clinical information in the electronic or other health record

## 2022-04-14 ENCOUNTER — Telehealth (INDEPENDENT_AMBULATORY_CARE_PROVIDER_SITE_OTHER): Payer: BC Managed Care – PPO | Admitting: Family Medicine

## 2022-04-14 ENCOUNTER — Encounter (INDEPENDENT_AMBULATORY_CARE_PROVIDER_SITE_OTHER): Payer: Self-pay | Admitting: Family Medicine

## 2022-04-14 DIAGNOSIS — G4733 Obstructive sleep apnea (adult) (pediatric): Secondary | ICD-10-CM

## 2022-04-14 DIAGNOSIS — E669 Obesity, unspecified: Secondary | ICD-10-CM

## 2022-04-14 DIAGNOSIS — Z6839 Body mass index (BMI) 39.0-39.9, adult: Secondary | ICD-10-CM | POA: Diagnosis not present

## 2022-04-14 DIAGNOSIS — F418 Other specified anxiety disorders: Secondary | ICD-10-CM | POA: Diagnosis not present

## 2022-04-14 DIAGNOSIS — Z6841 Body Mass Index (BMI) 40.0 and over, adult: Secondary | ICD-10-CM

## 2022-05-26 ENCOUNTER — Ambulatory Visit (INDEPENDENT_AMBULATORY_CARE_PROVIDER_SITE_OTHER): Payer: BC Managed Care – PPO | Admitting: Family Medicine

## 2022-05-26 ENCOUNTER — Encounter (INDEPENDENT_AMBULATORY_CARE_PROVIDER_SITE_OTHER): Payer: Self-pay | Admitting: Family Medicine

## 2022-05-26 VITALS — BP 138/89 | HR 83 | Temp 98.0°F | Ht 65.0 in | Wt 236.4 lb

## 2022-05-26 DIAGNOSIS — E669 Obesity, unspecified: Secondary | ICD-10-CM

## 2022-05-26 DIAGNOSIS — E88819 Insulin resistance, unspecified: Secondary | ICD-10-CM | POA: Diagnosis not present

## 2022-05-26 DIAGNOSIS — Z6839 Body mass index (BMI) 39.0-39.9, adult: Secondary | ICD-10-CM | POA: Diagnosis not present

## 2022-05-26 DIAGNOSIS — E559 Vitamin D deficiency, unspecified: Secondary | ICD-10-CM

## 2022-05-26 MED ORDER — VITAMIN D (ERGOCALCIFEROL) 1.25 MG (50000 UNIT) PO CAPS: 50000.0000 [IU] | ORAL_CAPSULE | ORAL | 0 refills | Status: AC

## 2022-06-03 NOTE — Progress Notes (Signed)
Chief Complaint:   OBESITY Mary Larsen is here to discuss her progress with her obesity treatment plan along with follow-up of her obesity related diagnoses. Mary Larsen is on keeping a food journal and adhering to recommended goals of 1100-1300 calories and 85+ grams of protein and states she is following her eating plan approximately 85% of the time. Mary Larsen states she is doing 0 minutes 0 times per week.  Today's visit was #: 9 Starting weight: 252 lbs Starting date: 12/10/2020 Today's weight: 236 lbs Today's date: 05/26/2022 Total lbs lost to date: 16 Total lbs lost since last in-office visit: 0  Interim History: Mary Larsen did very well with avoiding holiday weight gain.  She had severe nausea and vomiting on her Wegovy and I suspect that she has a degree of gastroparesis.  She is off her GLP-1 now and she is feeling better.  Subjective:   1. Vitamin D deficiency Mary Larsen is on vitamin D, and her last vitamin D level was below goal.  She has not been taking her vitamin D regularly so her level is still likely uncontrolled.  2. Insulin resistance Mary Larsen is off her GLP-1, and she is doing well with decreasing simple carbohydrates.  She is working on increasing her exercise.  Assessment/Plan:   1. Vitamin D deficiency We will refill prescription Vitamin D for 1 month. Mary Larsen will follow-up for routine testing of Vitamin D, at least 2-3 times per year to avoid over-replacement.  - Vitamin D, Ergocalciferol, (DRISDOL) 1.25 MG (50000 UNIT) CAPS capsule; Take 1 capsule (50,000 Units total) by mouth every 7 (seven) days.  Dispense: 5 capsule; Refill: 0  2. Insulin resistance Mary Larsen was encouraged to increase lean protein and exercise to help prevent diabetes mellitus.  3. Obesity, Current BMI 39.3 Mary Larsen is currently in the action stage of change. As such, her goal is to continue with weight loss efforts. She has agreed to keeping a food journal and adhering to recommended goals of 1100-1300  calories and 85+ grams of protein daily.   Behavioral modification strategies: increasing lean protein intake.  Mary Larsen has agreed to follow-up with our clinic in 6 weeks. She was informed of the importance of frequent follow-up visits to maximize her success with intensive lifestyle modifications for her multiple health conditions.   Objective:   Blood pressure 138/89, pulse 83, temperature 98 F (36.7 C), height 5\' 5"  (1.651 m), weight 236 lb 6.4 oz (107.2 kg), SpO2 97 %. Body mass index is 39.34 kg/m.  General: Cooperative, alert, well developed, in no acute distress. HEENT: Conjunctivae and lids unremarkable. Cardiovascular: Regular rhythm.  Lungs: Normal work of breathing. Neurologic: No focal deficits.   Lab Results  Component Value Date   CREATININE 0.80 06/05/2019   BUN 13 06/05/2019   NA 139 06/05/2019   K 4.0 06/05/2019   CL 105 06/05/2019   CO2 27 06/05/2019   Lab Results  Component Value Date   ALT 55 (H) 06/05/2019   AST 32 06/05/2019   ALKPHOS 76 06/05/2019   BILITOT 1.2 06/05/2019   Lab Results  Component Value Date   HGBA1C 5.1 09/16/2017   Lab Results  Component Value Date   INSULIN 15.1 12/10/2021   INSULIN 9.8 01/12/2018   INSULIN 16.2 09/16/2017   Lab Results  Component Value Date   TSH 3.450 09/16/2017   Lab Results  Component Value Date   CHOL 204 (H) 12/10/2021   HDL 59 12/10/2021   LDLCALC 127 (H) 12/10/2021   TRIG 102  12/10/2021   Lab Results  Component Value Date   VD25OH 29.3 (L) 01/12/2018   VD25OH 26.0 (L) 09/16/2017   Lab Results  Component Value Date   WBC 4.4 06/05/2019   HGB 13.0 06/05/2019   HCT 40.3 06/05/2019   MCV 89.6 06/05/2019   PLT 217 06/05/2019   No results found for: "IRON", "TIBC", "FERRITIN"  Attestation Statements:   Reviewed by clinician on day of visit: allergies, medications, problem list, medical history, surgical history, family history, social history, and previous encounter notes.   I,  Burt Knack, am acting as transcriptionist for Quillian Quince, MD.  I have reviewed the above documentation for accuracy and completeness, and I agree with the above. -  Quillian Quince, MD

## 2022-07-07 ENCOUNTER — Ambulatory Visit (INDEPENDENT_AMBULATORY_CARE_PROVIDER_SITE_OTHER): Payer: BC Managed Care – PPO | Admitting: Family Medicine

## 2022-07-15 ENCOUNTER — Ambulatory Visit: Payer: BC Managed Care – PPO

## 2022-07-30 ENCOUNTER — Encounter (INDEPENDENT_AMBULATORY_CARE_PROVIDER_SITE_OTHER): Payer: Self-pay | Admitting: Family Medicine

## 2022-08-25 IMAGING — MG MM DIGITAL SCREENING BILAT W/ TOMO AND CAD
6 of 10 series · 6 of 30 positions shown · non-contrast
Comparison: Previous exam(s).

ACR Breast Density Category a: The breast tissue is almost entirely
fatty.

CLINICAL DATA: Screening.

EXAM:
DIGITAL SCREENING BILATERAL MAMMOGRAM WITH TOMOSYNTHESIS AND CAD
TECHNIQUE: Bilateral screening digital craniocaudal and mediolateral oblique
mammograms were obtained. Bilateral screening digital breast
tomosynthesis was performed. The images were evaluated with
computer-aided detection.

[L CC synth-2D]
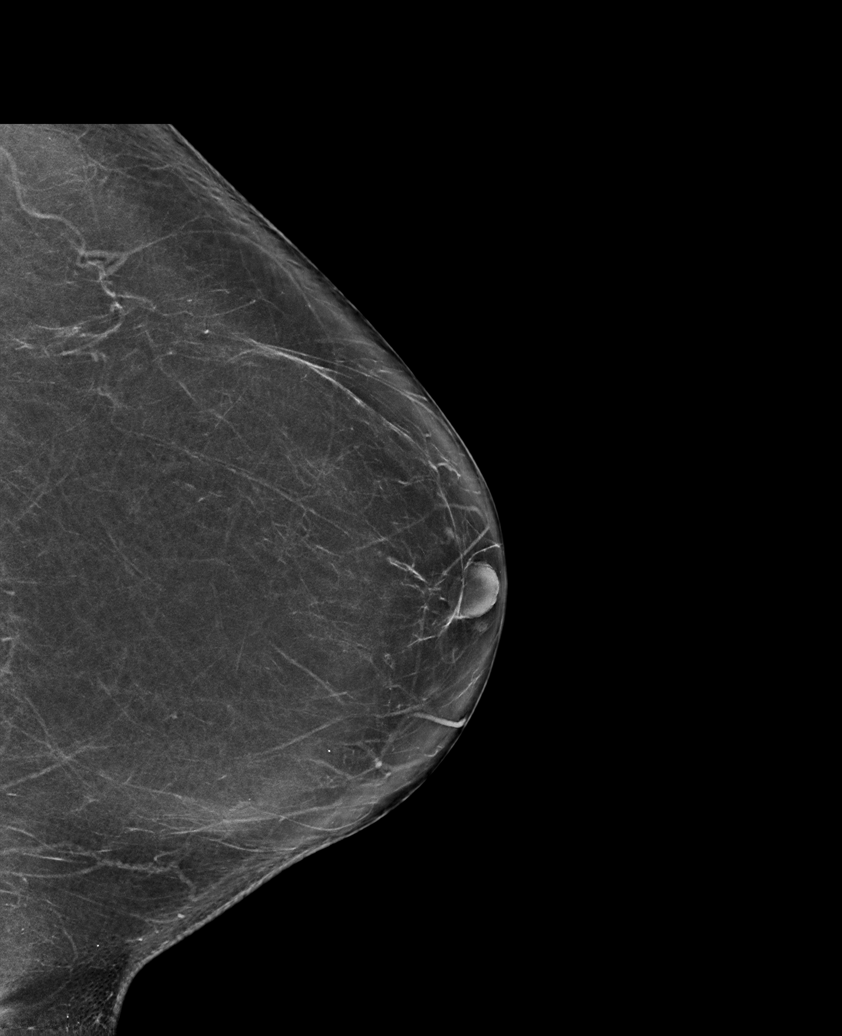

[L MLO synth-2D]
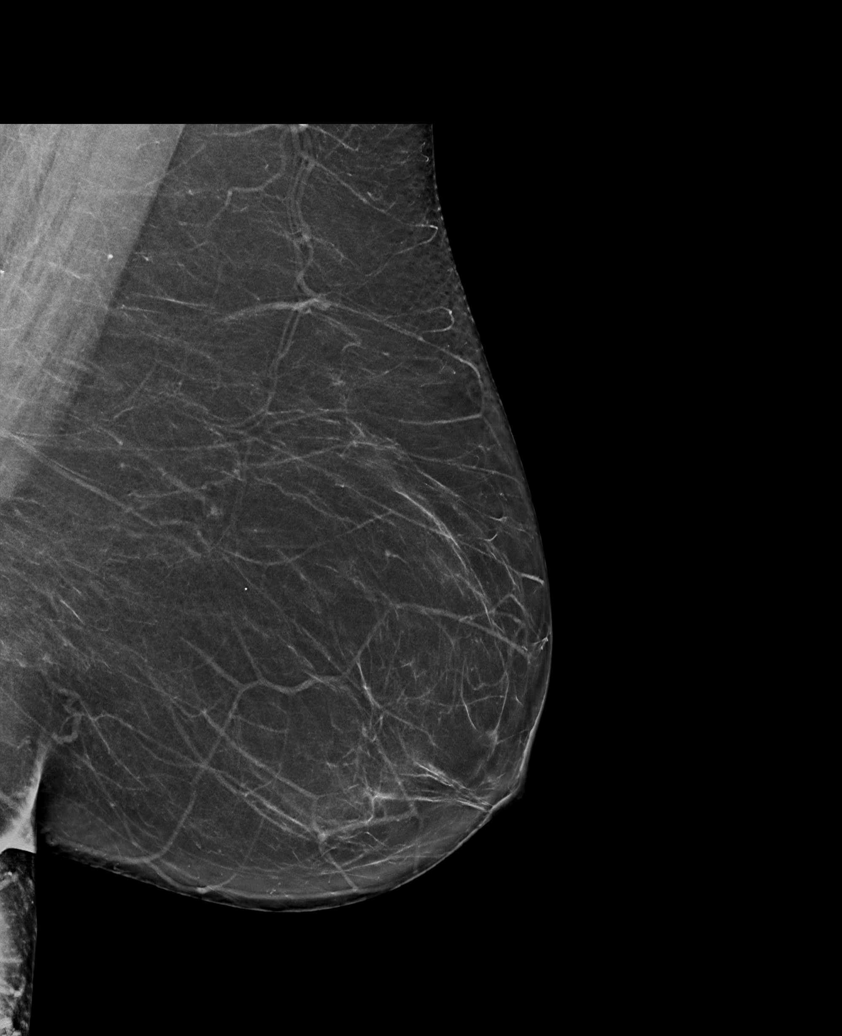

[R CV synth-2D]
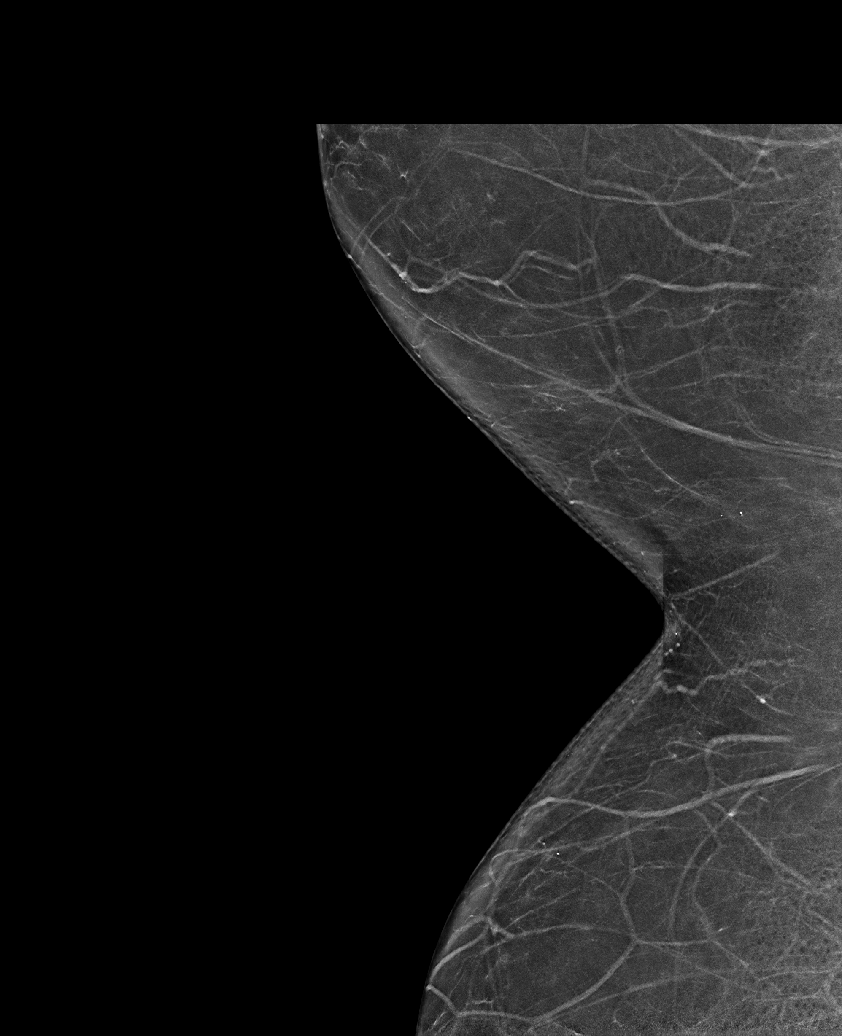

[R CC synth-2D]
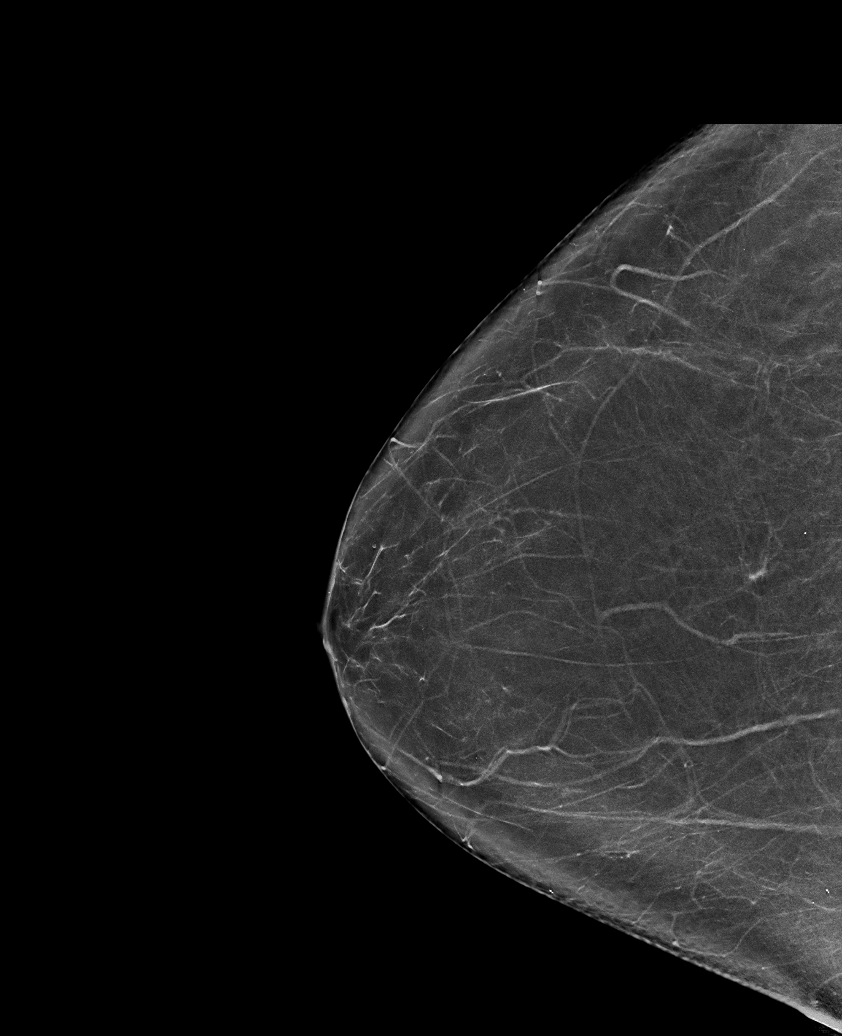

[R MLO synth-2D]
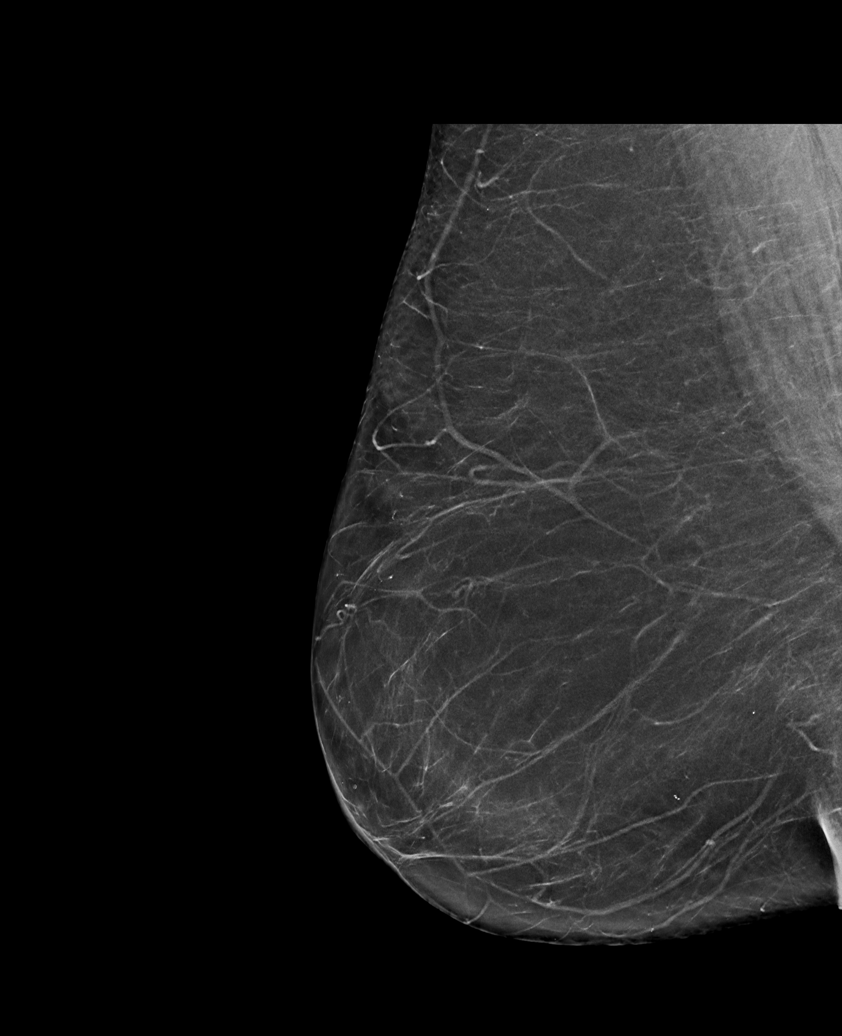

[R MLO tomo · tomo slice 39/77.0]
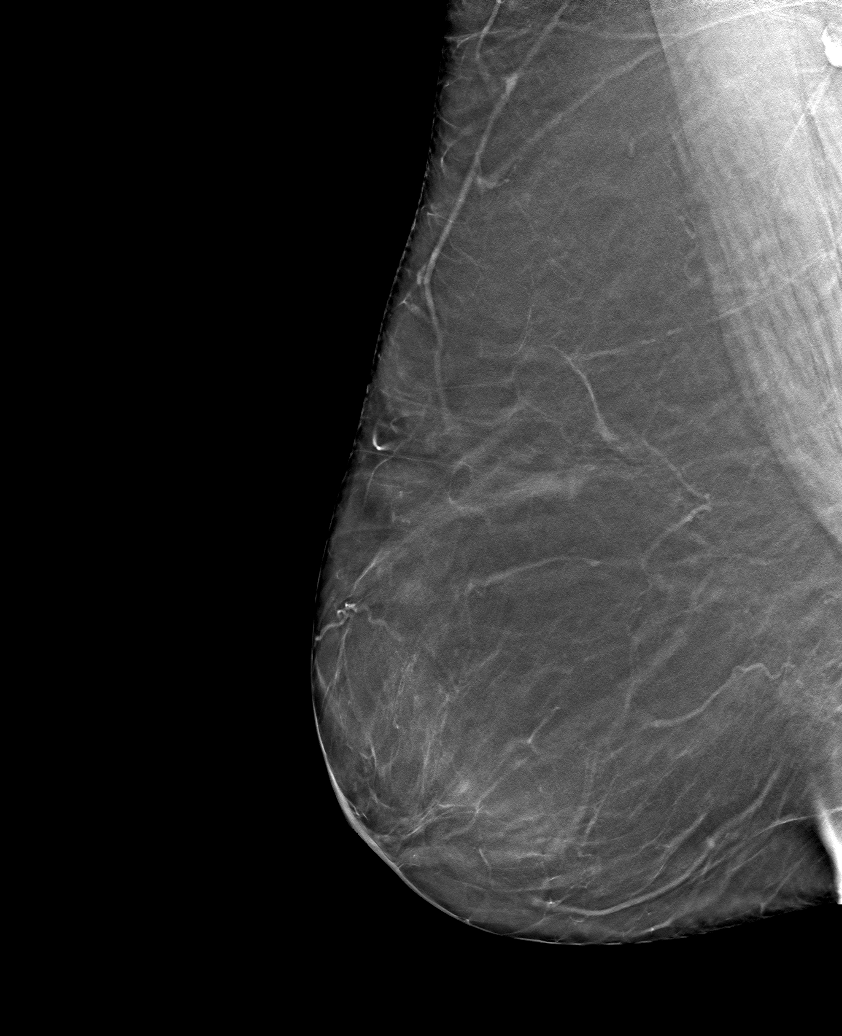

[6 of 30 positions shown; findings below may reference images not displayed]

FINDINGS: There are no findings suspicious for malignancy. The images were
evaluated with computer-aided detection.
IMPRESSION: No mammographic evidence of malignancy. A result letter of this
screening mammogram will be mailed directly to the patient.

RECOMMENDATION:
Screening mammogram in one year. (Code:JP-J-DD5)

BI-RADS CATEGORY  1: Negative.

## 2022-10-22 ENCOUNTER — Ambulatory Visit
Admission: RE | Admit: 2022-10-22 | Discharge: 2022-10-22 | Disposition: A | Discharge status: Home / Self Care | Payer: BC Managed Care – PPO | Source: Ambulatory Visit | Attending: Family Medicine | Admitting: Family Medicine

## 2022-10-22 DIAGNOSIS — Z1231 Encounter for screening mammogram for malignant neoplasm of breast: Secondary | ICD-10-CM

## 2023-02-10 ENCOUNTER — Other Ambulatory Visit: Payer: Self-pay | Admitting: Family Medicine

## 2023-02-10 DIAGNOSIS — M8588 Other specified disorders of bone density and structure, other site: Secondary | ICD-10-CM

## 2023-08-13 ENCOUNTER — Ambulatory Visit
Admission: RE | Admit: 2023-08-13 | Discharge: 2023-08-13 | Disposition: A | Discharge status: Home / Self Care | Payer: 59 | Source: Ambulatory Visit | Attending: Family Medicine | Admitting: Family Medicine

## 2023-08-13 DIAGNOSIS — M8588 Other specified disorders of bone density and structure, other site: Secondary | ICD-10-CM

## 2023-12-04 IMAGING — US US ABDOMEN COMPLETE
1 series · 14 of 25 positions shown · non-contrast
Comparison: CT abdomen 06/26/2003

CLINICAL DATA: Right upper quadrant abdominal pain

Elevated lipase
EXAM:
ABDOMEN ULTRASOUND COMPLETE

[Series 1: us abdomen complete · 0.20mm/px · 14 of 166 slices shown]
[im 1/166]
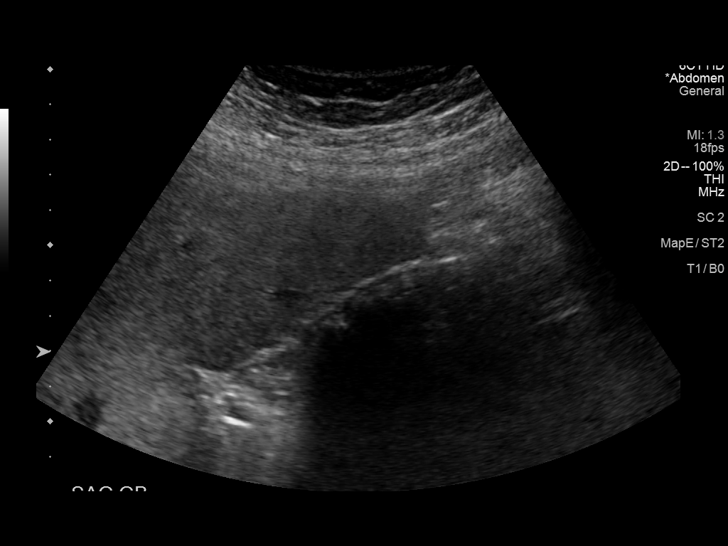
[im 14/166]
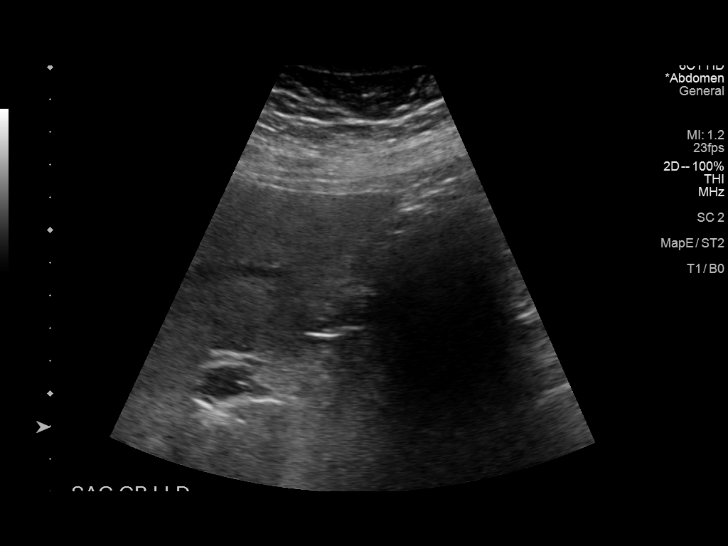
[im 28/166]
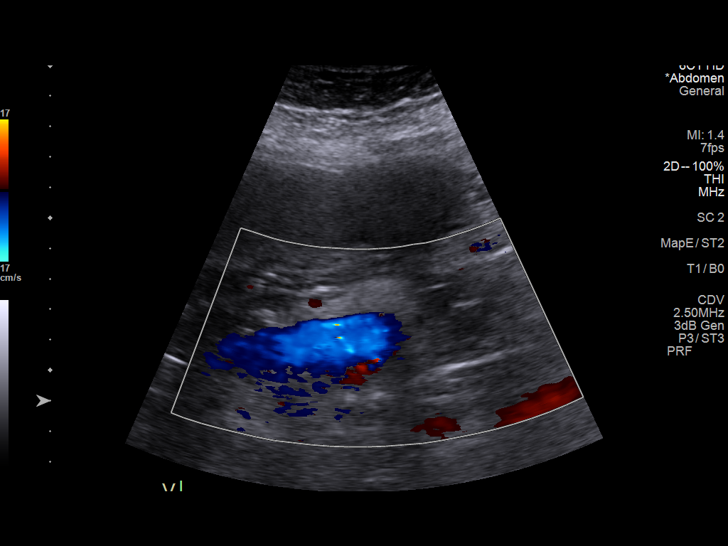
[im 42/166]
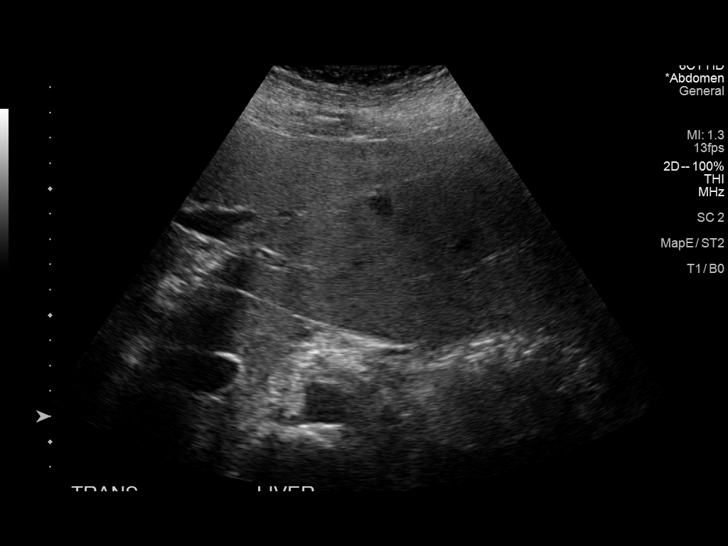
[im 56/166]
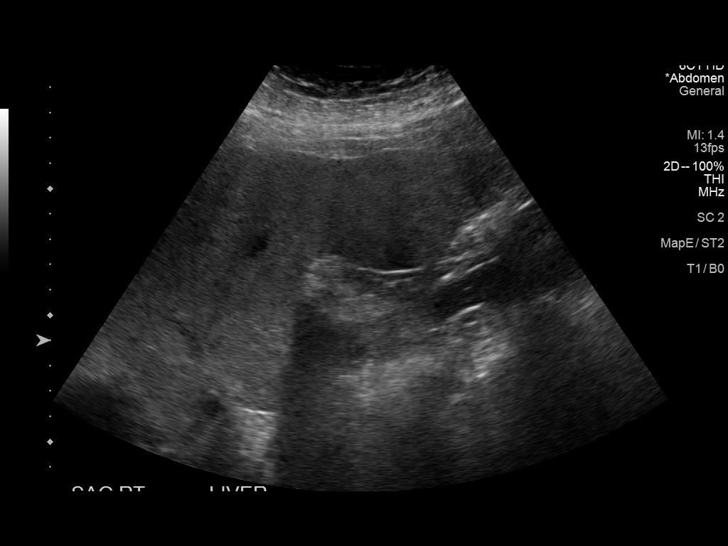
[im 62/166]
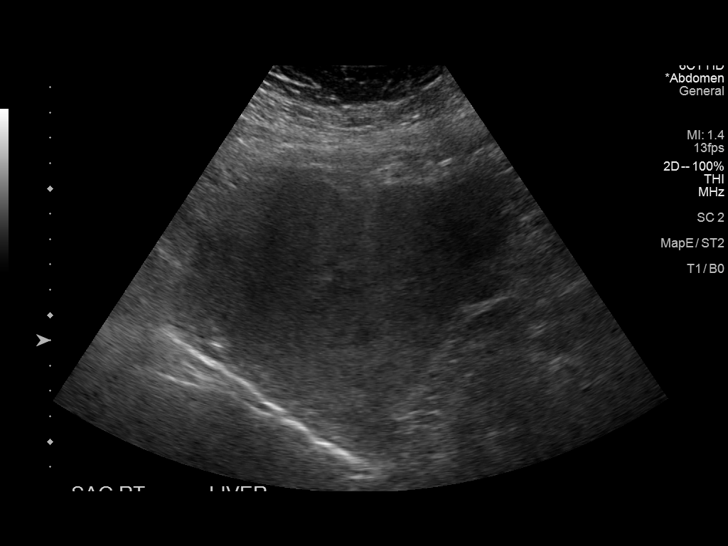
[im 76/166]
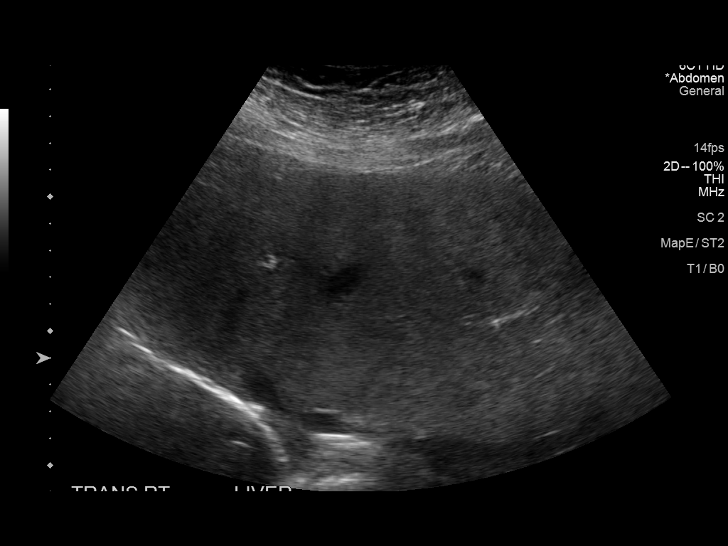
[im 90/166]
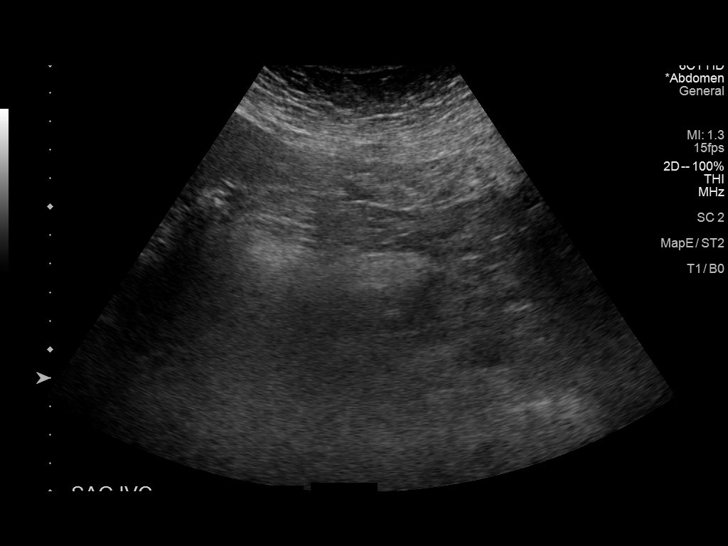
[im 104/166]
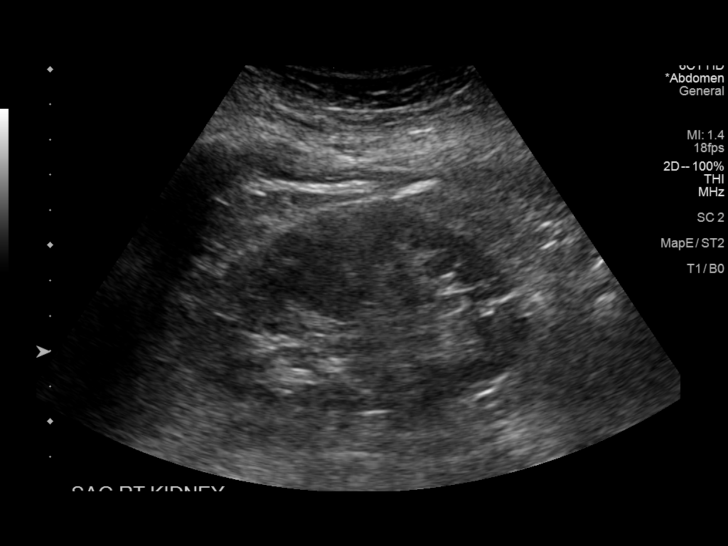
[im 111/166]
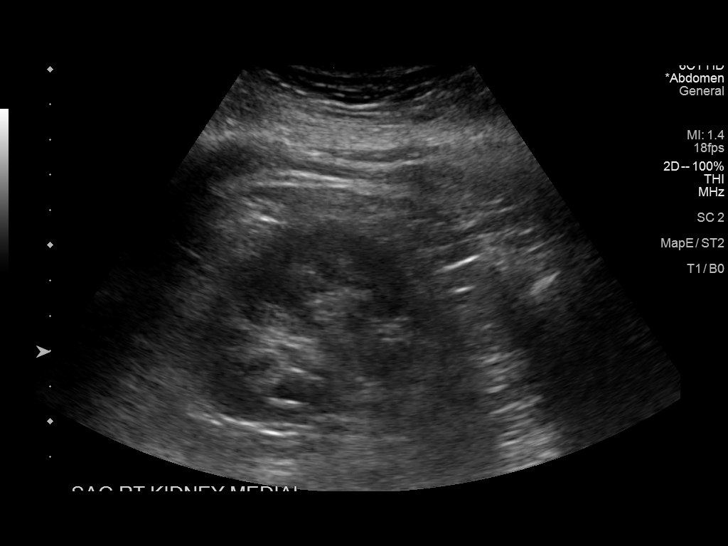
[im 124/166]
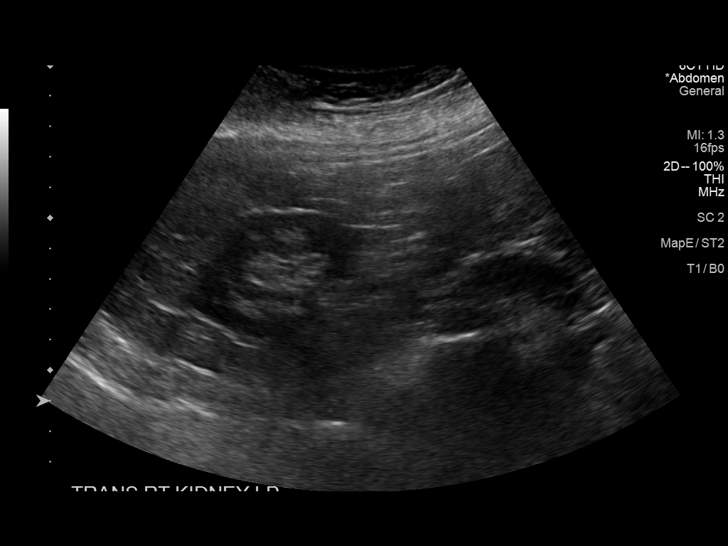
[im 138/166]
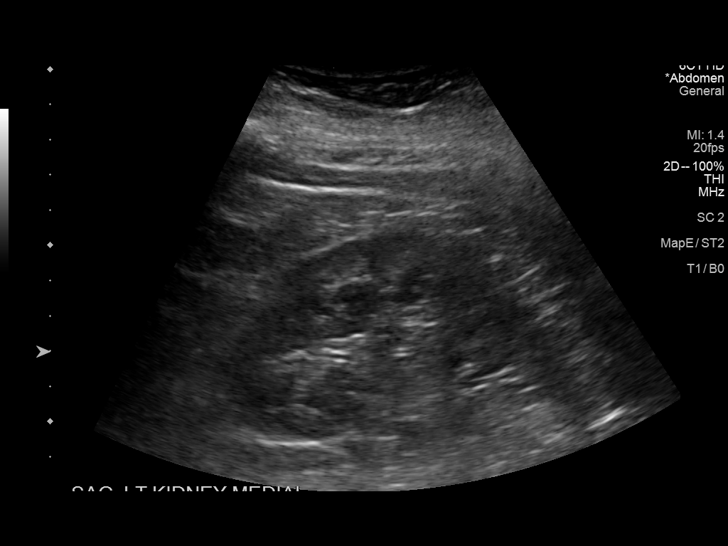
[im 152/166]
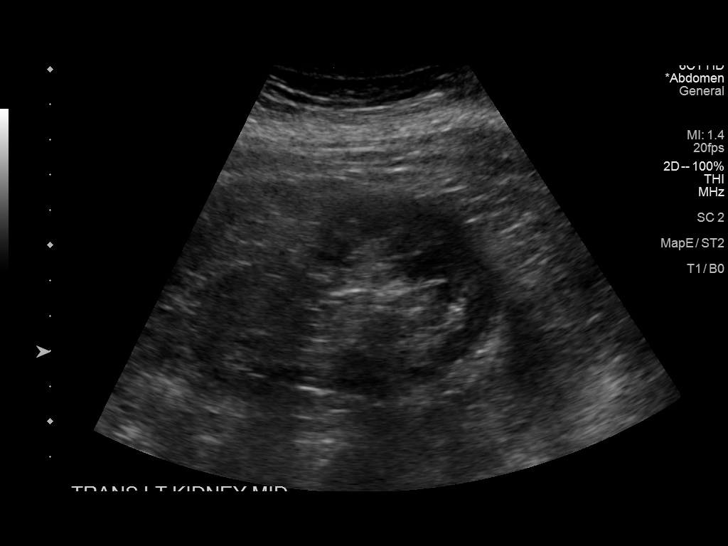
[im 166/166]
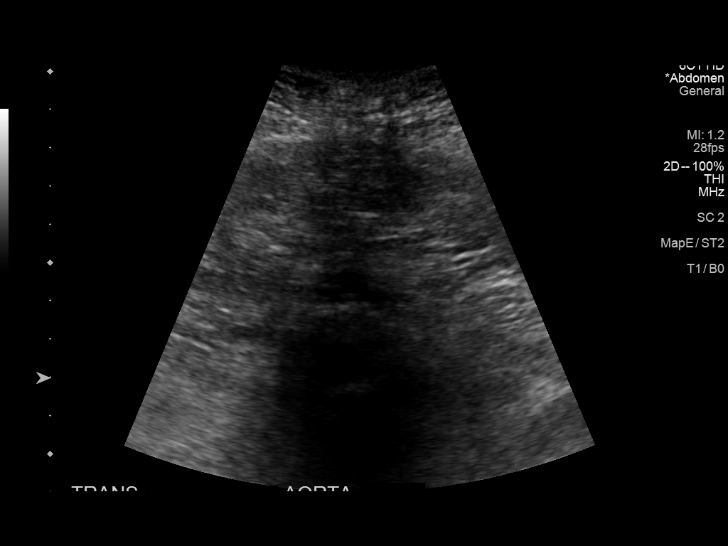

[14 of 25 positions shown; findings below may reference images not displayed]

FINDINGS: Gallbladder: Extensive cholelithiasis. No pericholecystic fluid or
gallbladder wall thickening. Sonographic Murphy sign negative per
technologist.

Common bile duct: Diameter: 2 mm

Liver: Mild diffuse increased echogenicity. No focal lesion. Portal
vein is patent on color Doppler imaging with normal direction of
blood flow towards the liver.

IVC: No abnormality visualized.

Pancreas: Visualized portion unremarkable.

Spleen: Unremarkable.

Right Kidney: Length: 9.6 cm. Echogenicity within normal limits. No
mass or hydronephrosis visualized.

Left Kidney: Length: 10.4 cm. Echogenicity within normal limits. No
mass or hydronephrosis visualized.

Abdominal aorta: No aneurysm visualized.

Other findings: None.
IMPRESSION: 1. Cholelithiasis without additional sonographic evidence of acute
cholecystitis.
2. Diffuse increased echogenicity of the hepatic parenchyma is a
nonspecific indicator of hepatocellular dysfunction, most commonly
steatosis.

## 2023-12-04 IMAGING — US US HEPATIC LIVER DOPPLER
1 series · 13 of 25 positions shown · non-contrast
Comparison: CT abdomen and 06/26/2003

CLINICAL DATA: Right upper quadrant abdominal pain

Elevated lipase
EXAM:
DUPLEX ULTRASOUND OF LIVER
TECHNIQUE: Color and duplex Doppler ultrasound was performed to evaluate the
hepatic in-flow and out-flow vessels.

[Series 1: us hepatic liver doppler · 0.20mm/px · 13 of 166 slices shown]
[im 1/166]
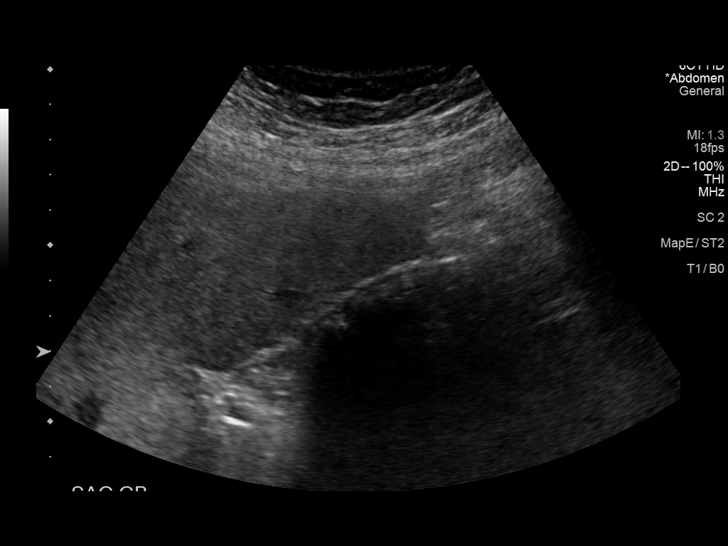
[im 14/166]
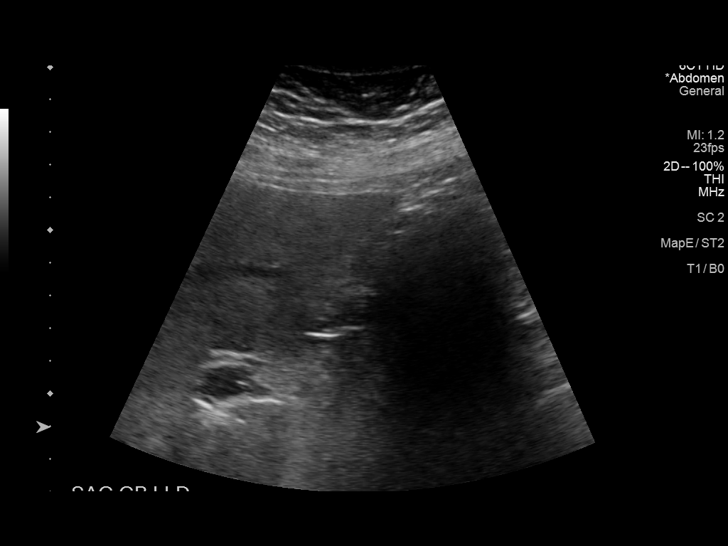
[im 28/166]
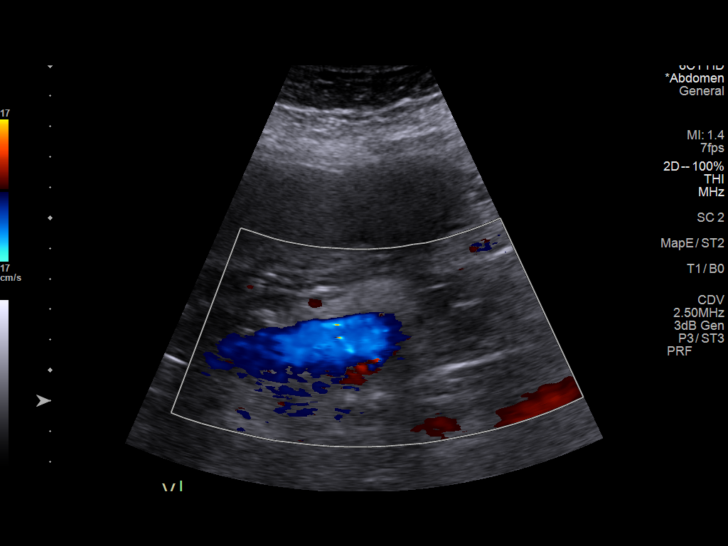
[im 42/166]
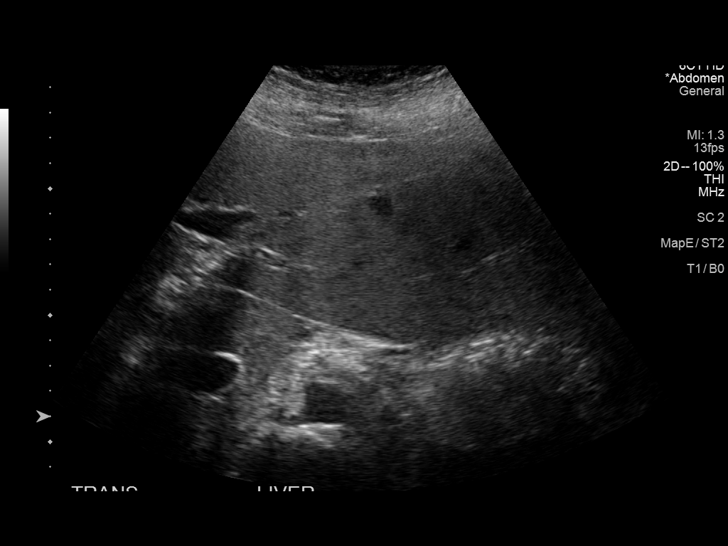
[im 56/166]
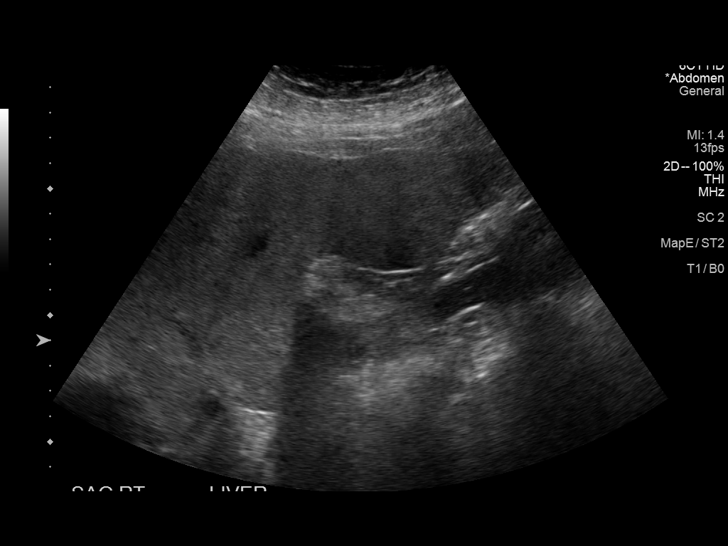
[im 69/166]
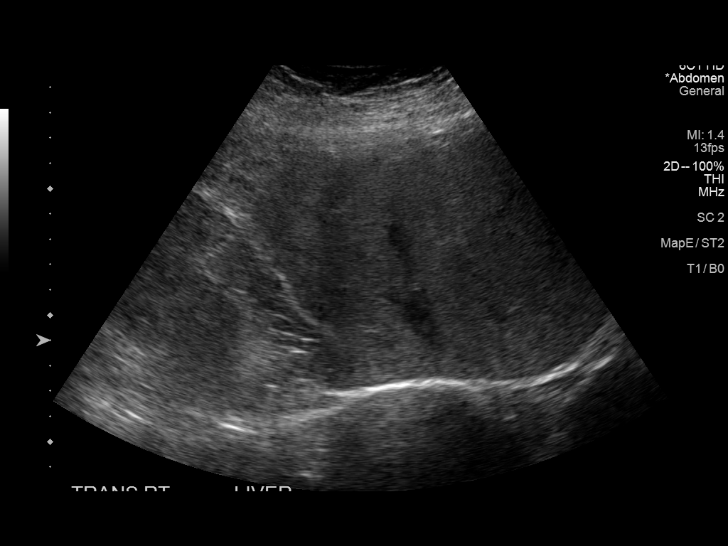
[im 83/166]
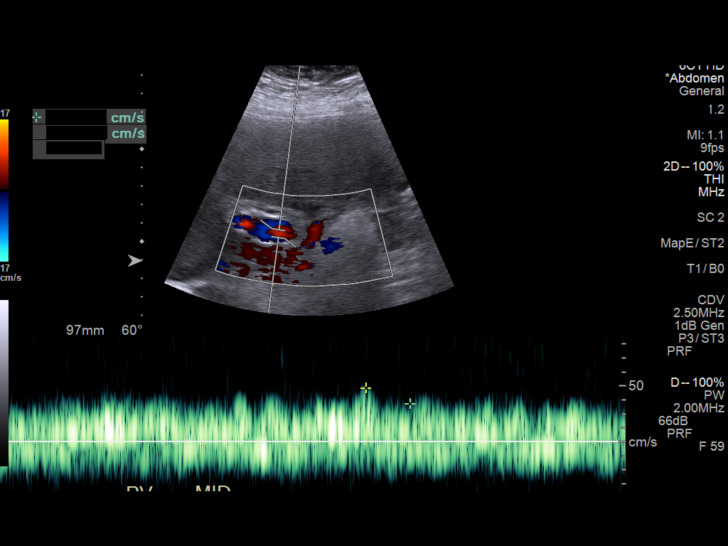
[im 97/166]
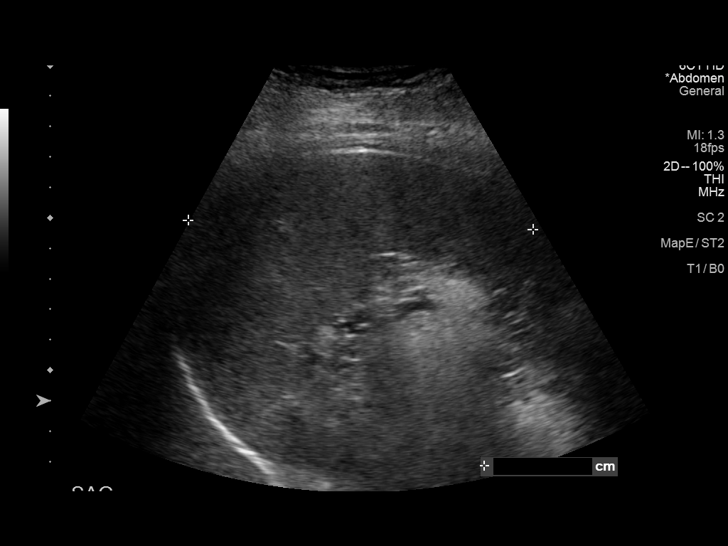
[im 111/166]
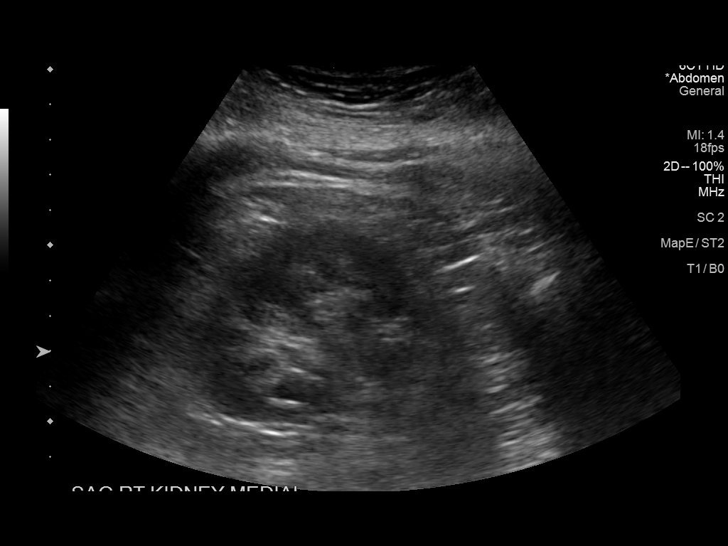
[im 124/166]
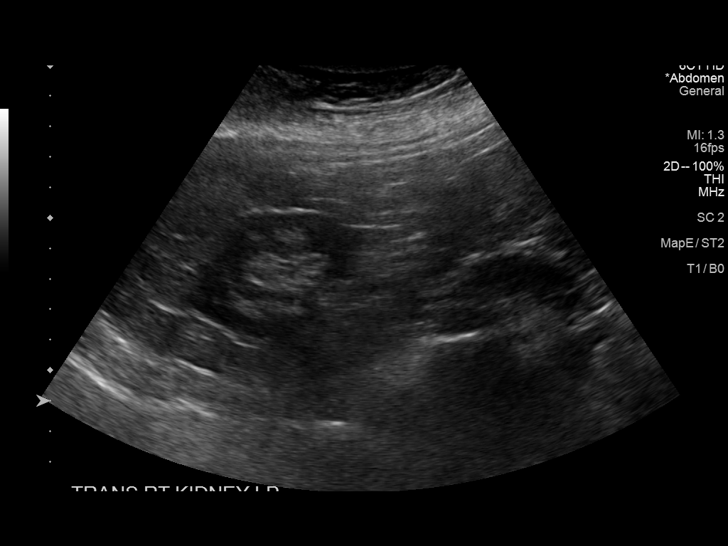
[im 138/166]
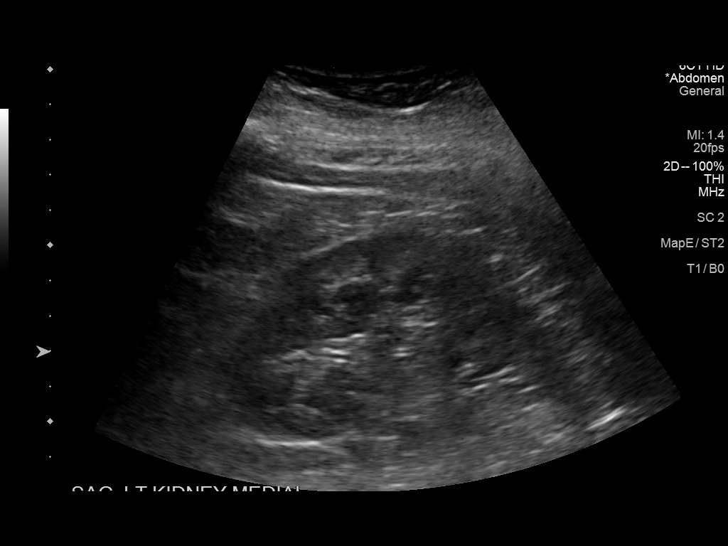
[im 152/166]
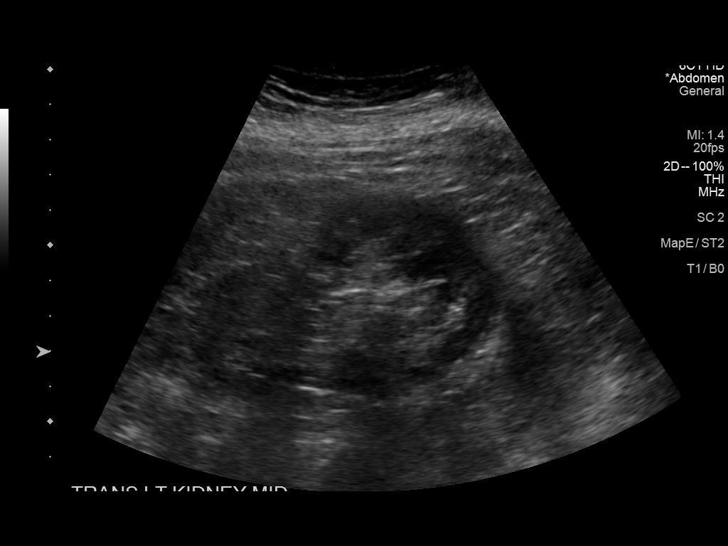
[im 166/166]
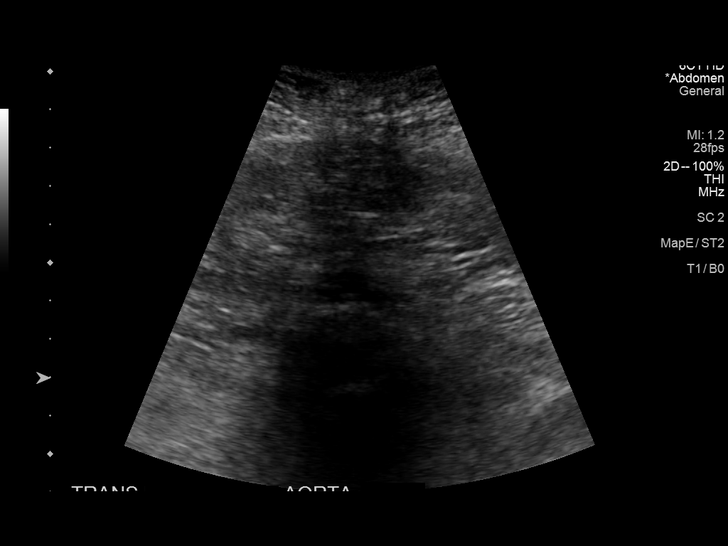

[13 of 25 positions shown; findings below may reference images not displayed]

FINDINGS: Liver: Mild diffuse increased echogenicity. Normal hepatic contour
without nodularity.

No focal lesion, mass or intrahepatic biliary ductal dilatation.

Main Portal Vein size: 1.1 cm

Portal Vein Velocities

Main Prox:  50 cm/sec

Main Mid: 48 cm/sec

Main Dist:  51 cm/sec
Right: 48 cm/sec
Left: 52 cm/sec

Hepatic Vein Velocities

Right:  14 cm/sec

Middle:  23 cm/sec

Left:  18 cm/sec

IVC: Present and patent with normal respiratory phasicity.

Hepatic Artery Velocity:  62 cm/sec

Splenic Vein Velocity:  51 cm/sec

Spleen: 11.3 cm x 10.1 cm x 6.5 cm with a total volume of 386 cm^3
(411 cm^3 is upper limit normal)

Portal Vein Occlusion/Thrombus: No

Splenic Vein Occlusion/Thrombus: No

Ascites: None

Varices: None
IMPRESSION: 1. Patent portal vein with appropriate directional flow.
2. Diffuse increased echogenicity of the hepatic parenchyma is a
nonspecific indicator of hepatocellular dysfunction, most commonly
steatosis.
3. Cholelithiasis.

## 2023-12-20 ENCOUNTER — Other Ambulatory Visit: Payer: Self-pay | Admitting: Family Medicine

## 2023-12-20 DIAGNOSIS — Z1231 Encounter for screening mammogram for malignant neoplasm of breast: Secondary | ICD-10-CM

## 2023-12-23 ENCOUNTER — Ambulatory Visit
Admission: RE | Admit: 2023-12-23 | Discharge: 2023-12-23 | Disposition: A | Discharge status: Home / Self Care | Source: Ambulatory Visit | Attending: Family Medicine | Admitting: Family Medicine

## 2023-12-23 DIAGNOSIS — Z1231 Encounter for screening mammogram for malignant neoplasm of breast: Secondary | ICD-10-CM
